# Patient Record
Sex: Female | Born: 1955 | Race: Black or African American | Hispanic: No | Marital: Single | Smoking: Never smoker
Health system: Southern US, Community
[De-identification: ages and names within clinical notes are randomized; demographics above are authoritative.]

## PROBLEM LIST (undated history)

## (undated) DIAGNOSIS — F419 Anxiety disorder, unspecified: Secondary | ICD-10-CM

## (undated) DIAGNOSIS — C801 Malignant (primary) neoplasm, unspecified: Secondary | ICD-10-CM

## (undated) DIAGNOSIS — M1990 Unspecified osteoarthritis, unspecified site: Secondary | ICD-10-CM

## (undated) DIAGNOSIS — M810 Age-related osteoporosis without current pathological fracture: Secondary | ICD-10-CM

## (undated) DIAGNOSIS — I10 Essential (primary) hypertension: Secondary | ICD-10-CM

## (undated) DIAGNOSIS — C9590 Leukemia, unspecified not having achieved remission: Secondary | ICD-10-CM

## (undated) HISTORY — DX: Unspecified osteoarthritis, unspecified site: M19.90

## (undated) HISTORY — DX: Anxiety disorder, unspecified: F41.9

## (undated) HISTORY — DX: Age-related osteoporosis without current pathological fracture: M81.0

## (undated) HISTORY — DX: Essential (primary) hypertension: I10

## (undated) HISTORY — DX: Malignant (primary) neoplasm, unspecified: C80.1

## (undated) HISTORY — DX: Leukemia, unspecified not having achieved remission: C95.90

---

## 1985-11-21 HISTORY — PX: TUBAL LIGATION: SHX77

## 1995-11-22 DIAGNOSIS — C9590 Leukemia, unspecified not having achieved remission: Secondary | ICD-10-CM

## 1995-11-22 HISTORY — PX: CHOLECYSTECTOMY: SHX55

## 1995-11-22 HISTORY — DX: Leukemia, unspecified not having achieved remission: C95.90

## 2004-11-21 ENCOUNTER — Emergency Department (HOSPITAL_COMMUNITY): Admission: EM | Admit: 2004-11-21 | Discharge: 2004-11-21 | Payer: Self-pay | Admitting: Emergency Medicine

## 2004-12-06 ENCOUNTER — Encounter (HOSPITAL_COMMUNITY): Admission: RE | Admit: 2004-12-06 | Discharge: 2005-01-05 | Payer: Self-pay | Admitting: Family Medicine

## 2004-12-23 ENCOUNTER — Ambulatory Visit (HOSPITAL_COMMUNITY): Admission: RE | Admit: 2004-12-23 | Discharge: 2004-12-23 | Payer: Self-pay | Admitting: Family Medicine

## 2008-12-01 ENCOUNTER — Encounter: Payer: Self-pay | Admitting: Orthopedic Surgery

## 2008-12-04 ENCOUNTER — Ambulatory Visit: Payer: Self-pay | Admitting: Orthopedic Surgery

## 2008-12-04 DIAGNOSIS — S9030XA Contusion of unspecified foot, initial encounter: Secondary | ICD-10-CM | POA: Insufficient documentation

## 2009-03-09 ENCOUNTER — Other Ambulatory Visit: Admission: RE | Admit: 2009-03-09 | Discharge: 2009-03-09 | Payer: Self-pay | Admitting: Obstetrics & Gynecology

## 2010-06-30 ENCOUNTER — Other Ambulatory Visit: Admission: RE | Admit: 2010-06-30 | Discharge: 2010-06-30 | Payer: Self-pay | Admitting: Obstetrics & Gynecology

## 2010-12-12 ENCOUNTER — Encounter: Payer: Self-pay | Admitting: Obstetrics & Gynecology

## 2010-12-12 ENCOUNTER — Encounter: Payer: Self-pay | Admitting: Pediatrics

## 2012-02-16 ENCOUNTER — Other Ambulatory Visit (HOSPITAL_COMMUNITY)
Admission: RE | Admit: 2012-02-16 | Discharge: 2012-02-16 | Disposition: A | Discharge status: Home / Self Care | Payer: BC Managed Care – PPO | Source: Ambulatory Visit | Attending: Obstetrics & Gynecology | Admitting: Obstetrics & Gynecology

## 2012-02-16 DIAGNOSIS — Z01419 Encounter for gynecological examination (general) (routine) without abnormal findings: Secondary | ICD-10-CM | POA: Insufficient documentation

## 2012-02-20 ENCOUNTER — Other Ambulatory Visit: Payer: Self-pay | Admitting: Obstetrics & Gynecology

## 2012-08-02 ENCOUNTER — Ambulatory Visit (INDEPENDENT_AMBULATORY_CARE_PROVIDER_SITE_OTHER): Payer: BC Managed Care – PPO | Admitting: Family Medicine

## 2012-08-02 ENCOUNTER — Encounter: Payer: Self-pay | Admitting: Family Medicine

## 2012-08-02 VITALS — BP 146/80 | HR 84 | Resp 18 | Ht 65.0 in | Wt 211.1 lb

## 2012-08-02 DIAGNOSIS — I10 Essential (primary) hypertension: Secondary | ICD-10-CM

## 2012-08-02 DIAGNOSIS — E669 Obesity, unspecified: Secondary | ICD-10-CM

## 2012-08-02 DIAGNOSIS — G4700 Insomnia, unspecified: Secondary | ICD-10-CM

## 2012-08-02 MED ORDER — FELODIPINE ER 10 MG PO TB24: 10.0000 mg | ORAL_TABLET | Freq: Every day | ORAL | Status: DC

## 2012-08-02 NOTE — Patient Instructions (Addendum)
Bring a copy of your labs  Start calcium ( 1200mg ) and Vit D ( 800IU)  Continue walking  Flu shot given  F/U 6 months

## 2012-08-03 ENCOUNTER — Encounter: Payer: Self-pay | Admitting: Family Medicine

## 2012-08-03 DIAGNOSIS — E669 Obesity, unspecified: Secondary | ICD-10-CM | POA: Insufficient documentation

## 2012-08-03 DIAGNOSIS — G4700 Insomnia, unspecified: Secondary | ICD-10-CM | POA: Insufficient documentation

## 2012-08-03 DIAGNOSIS — I10 Essential (primary) hypertension: Secondary | ICD-10-CM | POA: Insufficient documentation

## 2012-08-03 NOTE — Assessment & Plan Note (Signed)
She uses prn xanax #60 given at a time. I will review records for more details

## 2012-08-03 NOTE — Progress Notes (Signed)
  Subjective:    Patient ID: Judith Walker, female    DOB: November 10, 1956, 56 y.o.   MRN: [id number]  HPI Patient presents to establish medical care. Last Dr. Stephania Fragmin [facility]. Medications and History reviewed No specific concerns History of Leukemia treated in 1997- 1999 by [facility], now cured.  Insomnia- uses prn xanax. HTN- blood pressure has been well controlled on [facility] Mammogram, PAP- UTD- [facility] Due for Colonoscopy    Review of Systems   GEN- denies fatigue, fever, weight loss,weakness, recent illness HEENT- denies eye drainage, change in vision, nasal discharge, CVS- denies chest pain, palpitations RESP- denies SOB, cough, wheeze ABD- denies N/V, change in stools, abd pain GU- denies dysuria, hematuria, dribbling, incontinence MSK- denies joint pain, muscle aches, injury Neuro- denies headache, dizziness, syncope, seizure activity      Objective:   Physical Exam GEN- NAD, alert and oriented x3 HEENT- PERRL, EOMI, non injected sclera, pink conjunctiva, MMM, oropharynx clear Neck- Supple CVS- RRR, no murmur RESP-CTAB ABD-NABS,soft,NT,ND EXT- No edema Pulses- Radial, DP- 2+ Psych-normal affect and Mood        Assessment & Plan:    Discussed colonoscopy, 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

## 2012-08-03 NOTE — Assessment & Plan Note (Addendum)
No change to meds, she had labs recently done through her job for health screening she will bring a copy by

## 2012-10-08 ENCOUNTER — Other Ambulatory Visit: Payer: Self-pay

## 2013-04-05 ENCOUNTER — Other Ambulatory Visit: Payer: Self-pay | Admitting: Family Medicine

## 2013-04-12 ENCOUNTER — Ambulatory Visit (INDEPENDENT_AMBULATORY_CARE_PROVIDER_SITE_OTHER): Payer: BC Managed Care – PPO | Admitting: Family Medicine

## 2013-04-12 ENCOUNTER — Encounter: Payer: Self-pay | Admitting: Family Medicine

## 2013-04-12 VITALS — BP 124/82 | HR 90 | Resp 18 | Ht 65.0 in | Wt 214.1 lb

## 2013-04-12 DIAGNOSIS — G4700 Insomnia, unspecified: Secondary | ICD-10-CM

## 2013-04-12 DIAGNOSIS — Z1231 Encounter for screening mammogram for malignant neoplasm of breast: Secondary | ICD-10-CM

## 2013-04-12 DIAGNOSIS — E669 Obesity, unspecified: Secondary | ICD-10-CM

## 2013-04-12 DIAGNOSIS — I10 Essential (primary) hypertension: Secondary | ICD-10-CM

## 2013-04-12 DIAGNOSIS — Z1211 Encounter for screening for malignant neoplasm of colon: Secondary | ICD-10-CM

## 2013-04-12 MED ORDER — FELODIPINE ER 10 MG PO TB24: 10.0000 mg | ORAL_TABLET | Freq: Every day | ORAL | Status: DC

## 2013-04-12 NOTE — Progress Notes (Signed)
  Subjective:    Patient ID: Judith Walker, female    DOB: Feb 14, 1956, 57 y.o.   MRN: [id number]  HPI  Patient to follow chronic medical problems. She has no specific concerns. She is overdue for colonoscopy and mammogram but she wants to put off until the summer. She's tolerating her blood pressure medications. She did have labs by her wellness nurse but she forgot to fax them to my office.  Review of Systems   GEN- denies fatigue, fever, weight loss,weakness, recent illness HEENT- denies eye drainage, change in vision, nasal discharge, CVS- denies chest pain, palpitations RESP- denies SOB, cough, wheeze ABD- denies N/V, change in stools, abd pain GU- denies dysuria, hematuria, dribbling, incontinence MSK- denies joint pain, muscle aches, injury Neuro- denies headache, dizziness, syncope, seizure activity      Objective:   Physical Exam GEN- NAD, alert and oriented x3 HEENT- PERRL, EOMI, non injected sclera, pink conjunctiva, MMM, oropharynx clear CVS- RRR, no murmur RESP-CTAB EXT- No edema Pulses- Radial, DP- 2+        Assessment & Plan:

## 2013-04-12 NOTE — Patient Instructions (Signed)
Continue to work on diet and exercise Blood pressure looks good Continue medications Referral for colonoscopy Call for mammogram  Dentist- Dr. Lawrence Marseilles  [phone] Please send me the labs  F/U 6 months

## 2013-04-12 NOTE — Assessment & Plan Note (Signed)
She's gained 3 pounds since her last visit she started a new exercise routine with her family members Goal is to be less than 200 pounds by her next visit

## 2013-04-12 NOTE — Assessment & Plan Note (Signed)
Blood pressure well controlled medications refilled. She did have her wellness labs done within the past 6 months with her job she forgot to fax these to me she will go ahead and do this next week

## 2013-04-12 NOTE — Assessment & Plan Note (Signed)
Rare use of Xanax her sleep has improved

## 2013-04-26 ENCOUNTER — Telehealth: Payer: Self-pay

## 2013-04-26 NOTE — Telephone Encounter (Signed)
Pt was referred by Dr. Jeanice Lim for screening colonoscopy. LMOM for a return call.

## 2013-04-29 NOTE — Telephone Encounter (Signed)
LM with pt's daughter for a return call.

## 2013-05-07 NOTE — Telephone Encounter (Signed)
Another Letter sent to pt.

## 2013-07-01 NOTE — Telephone Encounter (Signed)
Pt has not returned calls or responded to letters.

## 2013-07-01 NOTE — Telephone Encounter (Signed)
noted 

## 2013-10-10 ENCOUNTER — Telehealth: Payer: Self-pay | Admitting: Family Medicine

## 2013-10-10 NOTE — Telephone Encounter (Signed)
Okay to refill #60 tablets

## 2013-10-10 NOTE — Telephone Encounter (Signed)
Ok to refill 

## 2013-10-10 NOTE — Telephone Encounter (Signed)
Patient needs refill on Xanax . Son past away Monday , she didn't realize she was out until then . She hasn't sleep since then .   [employer].

## 2013-10-11 MED ORDER — ALPRAZOLAM 0.5 MG PO TABS: 0.5000 mg | ORAL_TABLET | Freq: Four times a day (QID) | ORAL | Status: DC | PRN

## 2013-10-11 NOTE — Telephone Encounter (Signed)
Med phoned in °

## 2013-10-15 ENCOUNTER — Ambulatory Visit (INDEPENDENT_AMBULATORY_CARE_PROVIDER_SITE_OTHER): Payer: BC Managed Care – PPO | Admitting: Family Medicine

## 2013-10-15 ENCOUNTER — Encounter: Payer: Self-pay | Admitting: Family Medicine

## 2013-10-15 VITALS — BP 112/72 | HR 88 | Temp 98.2°F | Resp 18 | Ht 64.0 in | Wt 214.0 lb

## 2013-10-15 DIAGNOSIS — I10 Essential (primary) hypertension: Secondary | ICD-10-CM

## 2013-10-15 DIAGNOSIS — G4700 Insomnia, unspecified: Secondary | ICD-10-CM

## 2013-10-15 DIAGNOSIS — F4321 Adjustment disorder with depressed mood: Secondary | ICD-10-CM

## 2013-10-15 DIAGNOSIS — Z23 Encounter for immunization: Secondary | ICD-10-CM

## 2013-10-15 MED ORDER — FELODIPINE ER 10 MG PO TB24: 10.0000 mg | ORAL_TABLET | Freq: Every day | ORAL | Status: DC

## 2013-10-15 MED ORDER — ALPRAZOLAM 0.5 MG PO TABS: 0.5000 mg | ORAL_TABLET | Freq: Four times a day (QID) | ORAL | Status: DC | PRN

## 2013-10-15 NOTE — Progress Notes (Signed)
  Subjective:    Patient ID: Judith Walker, female    DOB: 1955-12-25, 57 y.o.   MRN: [id number]  HPI  Patient here to follow chronic medical problems. She tells me today that her son who is 50 years old passed away from cardiac arrest and respiratory failure from pneumonia. This was approximately one week ago. She is asking that her Xanax be filled as she's having difficulty sleeping has run out of this medication a few weeks ago. Her family is very supportive she has her other 5 children and her husband around her. Her appetite is decreased but she does not feel like she is at a breaking point. She's tried to take it one day at a time.  She did have repeat wellness screen labs 1 month ago, they are pending for her job  Request FLu shot    Review of Systems   GEN- denies fatigue, fever, weight loss,weakness, recent illness HEENT- denies eye drainage, change in vision, nasal discharge, CVS- denies chest pain, palpitations RESP- denies SOB, cough, wheeze ABD- denies N/V, change in stools, abd pain Neuro- denies headache, dizziness, syncope, seizure activity          Objective:   Physical Exam   GEN- NAD, alert and oriented x3 HEENT- PERRL, EOMI, non injected sclera, pink conjunctiva, MMM, oropharynx clear Neck- Supple, no thryomegaly CVS- RRR, no murmur RESP-CTAB EXT- No edema Pulses- Radial, DP- 2+ Psych- Normal affect and mood, no SI          Assessment & Plan:

## 2013-10-15 NOTE — Assessment & Plan Note (Signed)
Have advised her to wear here to help her in any way possible. I have refilled her Xanax advised she can take a half to one tablet during the day if needed.

## 2013-10-15 NOTE — Assessment & Plan Note (Signed)
Blood pressure is well-controlled. Her labs will be faxed to me for her wellness screen in October. I will have her followup in 3 months I do not have labs at that time we will just repeat in the office

## 2013-10-15 NOTE — Assessment & Plan Note (Signed)
History of chronic insomnia. She was already on low-dose xanax

## 2013-10-15 NOTE — Patient Instructions (Signed)
Call if I can help in anyway way Okay to use xanax during the day if needed Fax your blood work F/U 3 months

## 2014-05-18 ENCOUNTER — Other Ambulatory Visit: Payer: Self-pay | Admitting: Family Medicine

## 2014-05-19 NOTE — Telephone Encounter (Signed)
Refill appropriate and filled per protocol. 

## 2014-11-18 ENCOUNTER — Encounter: Payer: BC Managed Care – PPO | Admitting: Family Medicine

## 2014-12-02 ENCOUNTER — Ambulatory Visit (INDEPENDENT_AMBULATORY_CARE_PROVIDER_SITE_OTHER): Payer: BLUE CROSS/BLUE SHIELD | Admitting: Family Medicine

## 2014-12-02 ENCOUNTER — Encounter: Payer: Self-pay | Admitting: Family Medicine

## 2014-12-02 VITALS — BP 118/68 | HR 82 | Temp 98.8°F | Resp 14 | Ht 64.0 in | Wt 216.0 lb

## 2014-12-02 DIAGNOSIS — Z1321 Encounter for screening for nutritional disorder: Secondary | ICD-10-CM

## 2014-12-02 DIAGNOSIS — Z23 Encounter for immunization: Secondary | ICD-10-CM

## 2014-12-02 DIAGNOSIS — I10 Essential (primary) hypertension: Secondary | ICD-10-CM

## 2014-12-02 DIAGNOSIS — E669 Obesity, unspecified: Secondary | ICD-10-CM

## 2014-12-02 DIAGNOSIS — Z111 Encounter for screening for respiratory tuberculosis: Secondary | ICD-10-CM

## 2014-12-02 DIAGNOSIS — Z0000 Encounter for general adult medical examination without abnormal findings: Secondary | ICD-10-CM

## 2014-12-02 DIAGNOSIS — G4700 Insomnia, unspecified: Secondary | ICD-10-CM

## 2014-12-02 LAB — CBC WITH DIFFERENTIAL/PLATELET
Basophils Absolute: 0.1 10*3/uL (ref 0.0–0.1)
Basophils Relative: 1 % (ref 0–1)
Eosinophils Absolute: 0.2 10*3/uL (ref 0.0–0.7)
Eosinophils Relative: 3 % (ref 0–5)
HEMATOCRIT: 41.3 % (ref 36.0–46.0)
HEMOGLOBIN: 14 g/dL (ref 12.0–15.0)
LYMPHS ABS: 2.6 10*3/uL (ref 0.7–4.0)
Lymphocytes Relative: 51 % — ABNORMAL HIGH (ref 12–46)
MCH: 30.7 pg (ref 26.0–34.0)
MCHC: 33.9 g/dL (ref 30.0–36.0)
MCV: 90.6 fL (ref 78.0–100.0)
MONO ABS: 0.4 10*3/uL (ref 0.1–1.0)
MONOS PCT: 8 % (ref 3–12)
MPV: 10.1 fL (ref 8.6–12.4)
NEUTROS ABS: 1.9 10*3/uL (ref 1.7–7.7)
NEUTROS PCT: 37 % — AB (ref 43–77)
Platelets: 313 10*3/uL (ref 150–400)
RBC: 4.56 MIL/uL (ref 3.87–5.11)
RDW: 14.4 % (ref 11.5–15.5)
WBC: 5.1 10*3/uL (ref 4.0–10.5)

## 2014-12-02 LAB — COMPREHENSIVE METABOLIC PANEL
ALBUMIN: 4 g/dL (ref 3.5–5.2)
ALT: 10 U/L (ref 0–35)
AST: 16 U/L (ref 0–37)
Alkaline Phosphatase: 88 U/L (ref 39–117)
BUN: 9 mg/dL (ref 6–23)
CALCIUM: 9.5 mg/dL (ref 8.4–10.5)
CO2: 26 meq/L (ref 19–32)
Chloride: 104 mEq/L (ref 96–112)
Creat: 0.77 mg/dL (ref 0.50–1.10)
GLUCOSE: 76 mg/dL (ref 70–99)
POTASSIUM: 4.4 meq/L (ref 3.5–5.3)
SODIUM: 137 meq/L (ref 135–145)
TOTAL PROTEIN: 7.8 g/dL (ref 6.0–8.3)
Total Bilirubin: 0.5 mg/dL (ref 0.2–1.2)

## 2014-12-02 LAB — TSH: TSH: 0.603 u[IU]/mL (ref 0.350–4.500)

## 2014-12-02 NOTE — Assessment & Plan Note (Signed)
Work on dietary changes and weight loss

## 2014-12-02 NOTE — Assessment & Plan Note (Signed)
Well controlled  Note discussed calcium/Vitamin D chews

## 2014-12-02 NOTE — Patient Instructions (Addendum)
I recommend eye visit once a year I recommend dental visit every 6 months Goal is to  Exercise 30 minutes 5 days a week We will send a letter with lab results  Schedule Mammogram Return stool cards F/u1 YEAR

## 2014-12-02 NOTE — Assessment & Plan Note (Signed)
Continue xanax as needed

## 2014-12-02 NOTE — Progress Notes (Signed)
Patient ID: Judith Walker, female   DOB: Jan 16, 1956, 59 y.o.   MRN: [id number]   Subjective:    Patient ID: Judith Walker, female    DOB: Oct 09, 1956, 59 y.o.   MRN: [id number]  Patient presents for CPE- no PAP  patient here for complete physical exam. She also has a form to be completed for her job as she works for [employer]. She has no concerns today. She is taking her blood pressure medicine as prescribed she rarely uses Xanax per request a refill on both medication. She's not had a mammogram is overdue for colonoscopy. She is followed by GYN for her Pap smears.  She had some labs done at her wellness exam for her job and her cholesterol looked fairly good her LDL was at 123 her fasting glucose was normal she's not had a renal function and CBC vitamin D level done.  Followed by eye doctor- Dr. Candace Cruise  Review Of Systems:  GEN- denies fatigue, fever, weight loss,weakness, recent illness HEENT- denies eye drainage, change in vision, nasal discharge, CVS- denies chest pain, palpitations RESP- denies SOB, cough, wheeze ABD- denies N/V, change in stools, abd pain GU- denies dysuria, hematuria, dribbling, incontinence MSK- denies joint pain, muscle aches, injury Neuro- denies headache, dizziness, syncope, seizure activity       Objective:    BP 118/68 mmHg  Pulse 82  Temp(Src) 98.8 F (37.1 C) (Oral)  Resp 14  Ht 5\' 4"  (1.626 m)  Wt 216 lb (97.977 kg)  BMI 37.06 kg/m2 GEN- NAD, alert and oriented x3,obese  HEENT- PERRL, EOMI, non injected sclera, pink conjunctiva, MMM, oropharynx clear Neck- Supple, no thyromegaly CVS- RRR, no murmur RESP-CTAB ABD-NABS,soft,NT,ND EXT- No edema Pulses- Radial, DP- 2+        Assessment & Plan:      Problem List Items Addressed This Visit      Unprioritized   Obesity   Insomnia   Essential hypertension, benign - Primary   Relevant Orders      CBC with Differential      Comprehensive metabolic panel      TSH    Other Visit  Diagnoses    Routine general medical examination at a health care facility        CPE done, PAP deferred to GYN, schedule Mammogram, stool cards, she does not want Colonoscopy until the summer Flu shot, PPD for work    Encounter for vitamin deficiency screening        Relevant Orders       Vitamin D, 25-hydroxy    Need for prophylactic vaccination and inoculation against influenza        Relevant Orders       Flu Vaccine QUAD 36+ mos PF IM (Fluarix Quad PF) (Completed)    Screening-pulmonary TB        Relevant Orders       PPD (Completed)       Note: This dictation was prepared with Dragon dictation along with smaller Company secretary. Any transcriptional errors that result from this process are unintentional.

## 2014-12-03 LAB — VITAMIN D 25 HYDROXY (VIT D DEFICIENCY, FRACTURES): Vit D, 25-Hydroxy: 9 ng/mL — ABNORMAL LOW (ref 30–100)

## 2014-12-04 ENCOUNTER — Ambulatory Visit: Payer: BLUE CROSS/BLUE SHIELD | Admitting: Family Medicine

## 2014-12-04 ENCOUNTER — Other Ambulatory Visit: Payer: Self-pay | Admitting: Family Medicine

## 2014-12-04 ENCOUNTER — Encounter: Payer: Self-pay | Admitting: Family Medicine

## 2014-12-04 DIAGNOSIS — Z111 Encounter for screening for respiratory tuberculosis: Secondary | ICD-10-CM

## 2014-12-04 LAB — TB SKIN TEST
INDURATION: 0 mm
TB Skin Test: NEGATIVE

## 2014-12-04 MED ORDER — VITAMIN D (ERGOCALCIFEROL) 1.25 MG (50000 UNIT) PO CAPS
50000.0000 [IU] | ORAL_CAPSULE | ORAL | Status: DC
Start: 2014-12-04 — End: 2016-01-14

## 2014-12-04 NOTE — Progress Notes (Signed)
Pt returns for reading of PPD TB skin test applied 12/02/14.  Site free of any reaction.  Report given to patient for her employer.

## 2014-12-05 ENCOUNTER — Other Ambulatory Visit: Payer: Self-pay | Admitting: Family Medicine

## 2014-12-05 NOTE — Telephone Encounter (Signed)
Okay to refill? 

## 2014-12-05 NOTE — Telephone Encounter (Signed)
LRF 10/15/14 #60 + 1.  LOV 11/25  OK refill?

## 2014-12-09 NOTE — Telephone Encounter (Signed)
rx called in

## 2014-12-30 ENCOUNTER — Other Ambulatory Visit: Payer: Self-pay | Admitting: Family Medicine

## 2014-12-31 NOTE — Telephone Encounter (Signed)
Medication refilled per protocol. 

## 2015-07-29 ENCOUNTER — Other Ambulatory Visit: Payer: Self-pay | Admitting: Family Medicine

## 2015-07-29 NOTE — Telephone Encounter (Signed)
Ok to refill Xanax??  Last office visit 12/02/2014.  Last refill 12/09/2014.

## 2015-07-29 NOTE — Telephone Encounter (Signed)
Okay to refill? 

## 2015-07-30 NOTE — Telephone Encounter (Signed)
Medication called to pharmacy. 

## 2016-01-08 ENCOUNTER — Other Ambulatory Visit: Payer: Self-pay | Admitting: Family Medicine

## 2016-01-08 ENCOUNTER — Encounter: Payer: Self-pay | Admitting: *Deleted

## 2016-01-08 NOTE — Telephone Encounter (Signed)
Medication filled x1 with no refills.   Requires office visit before any further refills can be given.   Letter sent.  

## 2016-01-14 ENCOUNTER — Encounter: Payer: Self-pay | Admitting: Family Medicine

## 2016-01-14 ENCOUNTER — Encounter: Payer: Self-pay | Admitting: Physician Assistant

## 2016-01-14 ENCOUNTER — Ambulatory Visit (INDEPENDENT_AMBULATORY_CARE_PROVIDER_SITE_OTHER): Payer: BLUE CROSS/BLUE SHIELD | Admitting: Physician Assistant

## 2016-01-14 VITALS — BP 124/80 | HR 96 | Temp 101.9°F | Resp 18 | Wt 213.0 lb

## 2016-01-14 DIAGNOSIS — J111 Influenza due to unidentified influenza virus with other respiratory manifestations: Secondary | ICD-10-CM

## 2016-01-14 DIAGNOSIS — R509 Fever, unspecified: Secondary | ICD-10-CM

## 2016-01-14 DIAGNOSIS — J029 Acute pharyngitis, unspecified: Secondary | ICD-10-CM | POA: Diagnosis not present

## 2016-01-14 LAB — INFLUENZA A AND B AG, IMMUNOASSAY
INFLUENZA A ANTIGEN: NOT DETECTED
Influenza B Antigen: NOT DETECTED

## 2016-01-14 LAB — STREP GROUP A AG, W/REFLEX TO CULT: STREGTOCOCCUS GROUP A AG SCREEN: NOT DETECTED

## 2016-01-14 MED ORDER — OSELTAMIVIR PHOSPHATE 75 MG PO CAPS: 75.0000 mg | ORAL_CAPSULE | Freq: Two times a day (BID) | ORAL | Status: DC

## 2016-01-14 NOTE — Progress Notes (Signed)
Patient ID: TERIANN BOLLE MRN: [id number], DOB: 1956/06/28, 60 y.o. Date of Encounter: 01/14/2016, 12:25 PM    Chief Complaint:  Chief Complaint  Patient presents with  . sick x 1 day    sore throat, fever, chills     HPI: 60 y.o. year old AA female presents with above.   States that symptoms started yesterday. developed scratchy throat then last night started feeling achy all over developed fevers and chills and cough. Last night she checked temperature and got 102. Here -- her temperature is 101.9. Says that she has a lot of cough. Says that she is blowing her nose some and does have some nasal congestion.  She lives alone. 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     Home Meds:   Outpatient Prescriptions Prior to Visit  Medication Sig Dispense Refill  . ALPRAZolam (XANAX) 0.5 MG tablet TAKE ONE TABLET EVERY 6 HOURS AS NEEDED 60 tablet 0  . aspirin 81 MG tablet Take 81 mg by mouth daily.    . felodipine (PLENDIL) 10 MG 24 hr tablet TAKE ONE (1) TABLET EACH DAY 30 tablet 0  . Calcium Carbonate-Vitamin D (CALCIUM + D PO) Take by mouth daily. Reported on 01/14/2016    . Vitamin D, Ergocalciferol, (DRISDOL) 50000 UNITS CAPS capsule Take 1 capsule (50,000 Units total) by mouth every 7 (seven) days. (Patient not taking: Reported on 01/14/2016) 28 capsule 0   No facility-administered medications prior to visit.    Allergies: No Known Allergies    Review of Systems: See HPI for pertinent ROS. All other ROS negative.    Physical Exam: Blood pressure 124/80, pulse 96, temperature 101.9 F (38.8 C), temperature source Oral, resp. rate 18, weight 213 lb (96.616 kg)., Body mass index is 36.54 kg/(m^2). General:  AAF. Appears in no acute distress. HEENT: Normocephalic, atraumatic, eyes without discharge, sclera non-icteric, nares are without discharge. Bilateral auditory canals clear, TM's are without  perforation, pearly grey and translucent with reflective cone of light bilaterally. Oral cavity moist, posterior pharynx without exudate, erythema, peritonsillar abscess.  Neck: Supple. No thyromegaly. No lymphadenopathy. Lungs: Clear bilaterally to auscultation without wheezes, rales, or rhonchi. Breathing is unlabored. Heart: Regular rhythm. No murmurs, rubs, or gallops. Msk:  Strength and tone normal for age. Extremities/Skin: Warm and dry. Neuro: Alert and oriented X 3. Moves all extremities spontaneously. Gait is normal. CNII-XII grossly in tact. Psych:  Responds to questions appropriately with a normal affect.   Results for orders placed or performed in visit on 01/14/16  STREP GROUP A AG, W/REFLEX TO CULT  Result Value Ref Range   SOURCE THROAT    STREGTOCOCCUS GROUP A AG SCREEN Not Detected   Influenza A and B Ag, Immunoassay  Result Value Ref Range   Source: NASAL    Influenza A Antigen Not Detected Not Detected   Influenza B Antigen Not Detected Not Detected     ASSESSMENT AND PLAN:  60 y.o. year old female with  1. Influenza Lou test negative but clinically consistent with influenza. Recommend that she go ahead and start Tamiflu immediately take as directed and complete it. She is to take Tylenol or Motrin to keep fever control. Can use other medications for symptom relief if needed. Follow up if symptoms worsen significantly or if they are not resolving in 7-10 days. Note given for out of work today and tomorrow which is Friday. Plan to  return to work Monday. She is to stay home and avoid spreading germs. - oseltamivir (TAMIFLU) 75 MG capsule; Take 1 capsule (75 mg total) by mouth 2 (two) times daily.  Dispense: 10 capsule; Refill: 0  2. Sorethroat - STREP GROUP A AG, W/REFLEX TO CULT - Influenza A and B Ag, Immunoassay  3. Fever and chills - STREP GROUP A AG, W/REFLEX TO CULT - Influenza A and B Ag, Immunoassay   Signed, [address] [place], [place],  [facility] 01/14/2016 12:25 PM

## 2016-02-19 ENCOUNTER — Other Ambulatory Visit: Payer: Self-pay | Admitting: Family Medicine

## 2016-02-19 NOTE — Telephone Encounter (Signed)
Refill appropriate and filled per protocol. 

## 2016-06-01 ENCOUNTER — Ambulatory Visit (INDEPENDENT_AMBULATORY_CARE_PROVIDER_SITE_OTHER): Payer: BLUE CROSS/BLUE SHIELD | Admitting: Physician Assistant

## 2016-06-01 ENCOUNTER — Encounter: Payer: Self-pay | Admitting: Physician Assistant

## 2016-06-01 VITALS — BP 120/88 | HR 84 | Temp 98.2°F | Resp 18 | Wt 214.0 lb

## 2016-06-01 DIAGNOSIS — I10 Essential (primary) hypertension: Secondary | ICD-10-CM | POA: Diagnosis not present

## 2016-06-01 DIAGNOSIS — E669 Obesity, unspecified: Secondary | ICD-10-CM

## 2016-06-01 DIAGNOSIS — E559 Vitamin D deficiency, unspecified: Secondary | ICD-10-CM

## 2016-06-01 MED ORDER — FELODIPINE ER 10 MG PO TB24: ORAL_TABLET | ORAL | Status: DC

## 2016-06-01 NOTE — Progress Notes (Signed)
Patient ID: SURIA DEW MRN: [id number], DOB: 1956/01/12, 60 y.o. Date of Encounter: @DATE @  Chief Complaint:  Chief Complaint  Patient presents with  . needs BP meds refilled    is fasting    HPI: 60 y.o. year old AA female  presents For routine office visit.  Her last routine office visit was with Dr. Buelah Manis 12/02/2014. I reviewed that office visit note today. At that time her blood pressure was controlled so current medication was continued without change. At that visit labs were performed. CBC CMET TSH were all normal.  Vitamin D level came back low at 9. Dr. Buelah Manis said to take vitamin D 50,000 units for 12 weeks then take calcium 1200 mg vitamin D 1000 units.  Today patient states that she did take the prescription vitamin D for the 12 weeks but then never did take the over-the-counter vitamin D. Says that she is taking calcium daily.  She continues to take her blood pressure pill daily and is having no adverse effects. She does not check her blood pressure at home or anywhere between office visits here. She states that she rarely needs to use Xanax.   Past Medical History  Diagnosis Date  . Hypertension   . Leukemia [facility] [place] Meds: Outpatient Prescriptions Prior to Visit  Medication Sig Dispense Refill  . ALPRAZolam (XANAX) 0.5 MG tablet TAKE ONE TABLET EVERY 6 HOURS AS NEEDED 60 tablet 0  . aspirin 81 MG tablet Take 81 mg by mouth daily.    . Calcium Carbonate-Vitamin D (CALCIUM + D PO) Take by mouth daily. Reported on 01/14/2016    . felodipine (PLENDIL) 10 MG 24 hr tablet TAKE ONE (1) TABLET BY MOUTH EVERY DAY 30 tablet 3  . oseltamivir (TAMIFLU) 75 MG capsule Take 1 capsule (75 mg total) by mouth 2 (two) times daily. 10 capsule 0   No facility-administered medications prior to visit.    Allergies: No Known Allergies  Social History   Social History  . Marital Status: Married    Spouse Name: N/A  . Number of  Children: N/A  . Years of Education: N/A   Occupational History  . Not on file.   Social History Main Topics  . Smoking status: Never Smoker   . Smokeless tobacco: Never Used  . Alcohol Use: No  . Drug Use: No  . Sexual Activity: Not Currently   Other Topics Concern  . Not on file   Social History Narrative    Family History  Problem Relation Age of Onset  . Hypertension Mother   . Hyperlipidemia Mother   . Heart disease Mother      Review of Systems:  See HPI for pertinent ROS. All other ROS negative.    Physical Exam: Blood pressure 120/88, pulse 84, temperature 98.2 F (36.8 C), temperature source Oral, resp. rate 18, weight 214 lb (97.07 kg)., Body mass index is 36.72 kg/(m^2). General: Obese AAF. Appears in no acute distress. Neck: Supple. No thyromegaly. No lymphadenopathy. No carotid bruit. Lungs: Clear bilaterally to auscultation without wheezes, rales, or rhonchi. Breathing is unlabored. Heart: RRR with S1 S2. No murmurs, rubs, or gallops. Musculoskeletal:  Strength and tone normal for age. Extremities/Skin: Warm and dry.  No LE edema.  Neuro: Alert and oriented X 3. Moves all extremities spontaneously. Gait is normal. CNII-XII grossly in tact. Psych:  Responds to questions appropriately with a normal affect.  ASSESSMENT AND PLAN:  60 y.o. year old female with  1. Essential hypertension, benign Blood Pressure is controlled/at goal. Continue current blood pressure medication.  2. Vitamin D deficiency 12/02/2014 vitamin D level was low at 9. After that-- patient did take the prescription strength vitamin D for 12 weeks, but never took any over-the-counter vitamin D after that. Recheck level at this time and then treat accordingly. - VITAMIN D 25 Hydroxy (Vit-D Deficiency, Fractures)  3. Obesity Again today discussed diet exercise to manage this and control her weight.  Anxiety She says that she very rarely needs to use the Xanax. No Rx printed at [facility]  today.   Reviewed that her last visit here was 1 year 6 months ago. Discussed with her that she needs to have follow-up office visit here 6 months.   Signed, [address] [place], [place], [facility] 06/01/2016 10:15 AM

## 2016-06-02 ENCOUNTER — Telehealth: Payer: Self-pay | Admitting: Family Medicine

## 2016-06-02 LAB — VITAMIN D 25 HYDROXY (VIT D DEFICIENCY, FRACTURES): VIT D 25 HYDROXY: 20 ng/mL — AB (ref 30–100)

## 2016-06-02 NOTE — Telephone Encounter (Signed)
Pt aware of lab results and provider recommendations 

## 2016-06-02 NOTE — Telephone Encounter (Signed)
-----   Message from Orlena Sheldon, PA-C sent at 06/02/2016  9:19 AM EDT ----- At recent [facility], reviewed that in 11/2014--pt took the prescription vitamin D for the 12 weeks but then never started over-the-counter vitamin D after that. Tell her to start over-the-counter vitamin D 2000 units daily.

## 2016-09-15 ENCOUNTER — Ambulatory Visit (INDEPENDENT_AMBULATORY_CARE_PROVIDER_SITE_OTHER): Payer: BLUE CROSS/BLUE SHIELD | Admitting: *Deleted

## 2016-09-15 DIAGNOSIS — Z23 Encounter for immunization: Secondary | ICD-10-CM

## 2016-09-15 NOTE — Progress Notes (Signed)
Patient ID: Judith Walker, female   DOB: [birth year], 60 y.o.   MRN: [id number] Patient seen in office for Influenza Vaccination.   Tolerated IM administration well.   Immunization history updated.

## 2016-12-14 ENCOUNTER — Encounter: Payer: Self-pay | Admitting: Family Medicine

## 2016-12-14 ENCOUNTER — Ambulatory Visit (INDEPENDENT_AMBULATORY_CARE_PROVIDER_SITE_OTHER): Payer: BLUE CROSS/BLUE SHIELD | Admitting: Family Medicine

## 2016-12-14 VITALS — BP 120/68 | HR 90 | Temp 102.3°F | Resp 16 | Ht 64.0 in | Wt 207.0 lb

## 2016-12-14 DIAGNOSIS — R6889 Other general symptoms and signs: Secondary | ICD-10-CM

## 2016-12-14 DIAGNOSIS — B360 Pityriasis versicolor: Secondary | ICD-10-CM

## 2016-12-14 LAB — STREP GROUP A AG, W/REFLEX TO CULT: STREGTOCOCCUS GROUP A AG SCREEN: NOT DETECTED

## 2016-12-14 LAB — INFLUENZA A AND B AG, IMMUNOASSAY
INFLUENZA A ANTIGEN: NOT DETECTED
Influenza B Antigen: NOT DETECTED

## 2016-12-14 MED ORDER — IBUPROFEN 200 MG PO TABS
400.0000 mg | ORAL_TABLET | Freq: Once | ORAL | Status: AC
  Administered 2016-12-14: 400 mg via ORAL

## 2016-12-14 MED ORDER — OSELTAMIVIR PHOSPHATE 75 MG PO CAPS: 75.0000 mg | ORAL_CAPSULE | Freq: Two times a day (BID) | ORAL | 0 refills | Status: DC

## 2016-12-14 MED ORDER — KETOCONAZOLE 2 % EX SHAM: 1.0000 "application " | MEDICATED_SHAMPOO | CUTANEOUS | 2 refills | Status: DC

## 2016-12-14 MED ORDER — FLUCONAZOLE 100 MG PO TABS: ORAL_TABLET | ORAL | 0 refills | Status: DC

## 2016-12-14 MED ORDER — FIRST-DUKES MOUTHWASH MT SUSP: OROMUCOSAL | 0 refills | Status: DC

## 2016-12-14 NOTE — Patient Instructions (Addendum)
Apply the nizoral to your skin twice a day  Take anti-fungal -  Fluconazole   after your illness resolves  Schedule Physical for spring       Give note for work Tuesday - Friday due to flu symptoms  Can return on Monday

## 2016-12-14 NOTE — Addendum Note (Signed)
Addended by: Sheral Flow on: 12/14/2016 02:50 PM   Modules accepted: Orders

## 2016-12-14 NOTE — Progress Notes (Signed)
   Subjective:    Patient ID: Judith Walker, female    DOB: 1956-07-11, 61 y.o.   MRN: [id number]  Patient presents for Illness (x2 days- fever, chills, muscle aches, scratchy sore throat, nonproductive cough) Patient here with fever chills body aches sore throat and nonproductive cough which started 2 days ago. 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 She is taking ibuprofen for fever last night but nothing today. She's not had any vomiting no diarrhea. Her throat is very severe states she cannot eat regular food because of the pain she has been drinking fluids and has been eating applesauce.  She's also concerned about a rash that comes and goes mostly in her chest. States when she gets itchy or sweats he will pop out at least little white spots across her chest sometimes it goes away on its own. It often occurs during the summertime as well.   Review Of Systems: per above   GEN- + fatigue, +fever, weight loss,weakness, recent illness HEENT- denies eye drainage, change in vision, nasal discharge, CVS- denies chest pain, palpitations RESP- denies SOB, +cough, wheeze ABD- denies N/V, change in stools, abd pain GU- denies dysuria, hematuria, dribbling, incontinence MSK- denies joint pain, +muscle aches, injury Neuro- denies headache, dizziness, syncope, seizure activity       Objective:    BP 120/68 (BP Location: Left Arm, Patient Position: Sitting, Cuff Size: Large)   Pulse 90   Temp (!) 102.3 F (39.1 C) (Oral)   Resp 16   Ht 5\' 4"  (1.626 m)   Wt 207 lb (93.9 kg)   SpO2 98%   BMI 35.53 kg/m  GEN- NAD, alert and oriented x3 HEENT- PERRL, EOMI, non injected sclera, pink conjunctiva, MMM, oropharynx injected, no exudate, no maxillary sinus tenderness, TM clear bilat  Neck- Supple, + cervical LAD, ttp of nodes  CVS- RRR, no murmur RESP-CTAB ABD-NABS,soft,NT,ND  Skin- hypogmented macules scattered on chest and neck, no erythema, no vesivles  EXT- No  edema Pulses- Radial 2+        Assessment & Plan:      Problem List Items Addressed This Visit    None    Visit Diagnoses    Flu-like symptoms    -  Primary   Start tamilfu exposure with children and flu at work. Has significant pharngytis, sent over magic mouthwash as well, strep neg, throat culture pending    Relevant Orders   Influenza A and B Ag, Immunoassay (Completed)   STREP GROUP A AG, W/REFLEX TO CULT (Completed)   Tinea versicolor       Treat with nizoral topical and fluconzale q weekly for 4 weeks      Note: This dictation was prepared with Dragon dictation along with smaller phrase technology. Any transcriptional errors that result from this process are unintentional.

## 2016-12-16 LAB — CULTURE, GROUP A STREP

## 2017-03-28 ENCOUNTER — Other Ambulatory Visit: Payer: Self-pay | Admitting: Family Medicine

## 2017-04-13 ENCOUNTER — Encounter: Payer: Self-pay | Admitting: Physician Assistant

## 2017-04-13 ENCOUNTER — Ambulatory Visit (INDEPENDENT_AMBULATORY_CARE_PROVIDER_SITE_OTHER): Payer: BLUE CROSS/BLUE SHIELD | Admitting: Physician Assistant

## 2017-04-13 VITALS — BP 134/88 | HR 94 | Temp 98.1°F | Resp 14 | Wt 205.4 lb

## 2017-04-13 DIAGNOSIS — K0889 Other specified disorders of teeth and supporting structures: Secondary | ICD-10-CM | POA: Diagnosis not present

## 2017-04-13 MED ORDER — AMOXICILLIN 875 MG PO TABS: 875.0000 mg | ORAL_TABLET | Freq: Two times a day (BID) | ORAL | 0 refills | Status: DC

## 2017-04-13 MED ORDER — TRAMADOL HCL 50 MG PO TABS: ORAL_TABLET | ORAL | 0 refills | Status: DC

## 2017-04-13 NOTE — Progress Notes (Signed)
Patient ID: Judith Walker MRN: [id number], DOB: 1956/10/30, 61 y.o. Date of Encounter: 04/13/2017, 12:41 PM    Chief Complaint:  Chief Complaint  Patient presents with  . left side ear/face pain    x3days     HPI: 61 y.o. year old female presents with above.   Says that she knows that she has bad teeth that need to be pulled but does not have dental insurance so has been putting off any of this treatment. Left lower back tooth has been throbbing for the past 3 days. Also is having pain up towards the left ear and doesn't know if that is radiating up from the tooth or whether she has a problem with her ear.  No other complaints or concerns. Has had no fevers or chills. Has had no nasal congestion or mucus from the nose and no sore throat.     Home Meds:   Outpatient Medications Prior to Visit  Medication Sig Dispense Refill  . ALPRAZolam (XANAX) 0.5 MG tablet TAKE ONE TABLET EVERY 6 HOURS AS NEEDED 60 tablet 0  . aspirin 81 MG tablet Take 81 mg by mouth daily.    . Calcium Carbonate-Vitamin D (CALCIUM + D PO) Take by mouth daily. Reported on 01/14/2016    . Cholecalciferol (VITAMIN D3) 2000 units TABS Take 1 tablet by mouth daily.    . felodipine (PLENDIL) 10 MG 24 hr tablet TAKE ONE (1) TABLET BY MOUTH EVERY DAY 90 tablet 1  . ketoconazole (NIZORAL) 2 % shampoo Apply 1 application topically 2 (two) times a week. To affected areas 120 mL 2  . Diphenhyd-Hydrocort-Nystatin (FIRST-DUKES MOUTHWASH) SUSP Swish and gargle/swallow 1 teaspoon four times a day as needed for pain for 1 week 1 Bottle 0  . felodipine (PLENDIL) 10 MG 24 hr tablet TAKE ONE (1) TABLET BY MOUTH EVERY DAY 30 tablet 1  . fluconazole (DIFLUCAN) 100 MG tablet Take 1 tablet q weekly for 4 weeks 4 tablet 0  . oseltamivir (TAMIFLU) 75 MG capsule Take 1 capsule (75 mg total) by mouth 2 (two) times daily. 10 capsule 0   No facility-administered medications prior to visit.     Allergies: No Known Allergies     Review of Systems: See HPI for pertinent ROS. All other ROS negative.    Physical Exam: Blood pressure 134/88, pulse 94, temperature 98.1 F (36.7 C), temperature source Oral, resp. rate 14, weight 205 lb 6.4 oz (93.2 kg), SpO2 98 %., Body mass index is 35.26 kg/m. General:  AAF. Appears in no acute distress. HEENT: Normocephalic, atraumatic, eyes without discharge, sclera non-icteric, nares are without discharge. Bilateral auditory canals clear, TM's are without perforation, pearly grey and translucent with reflective cone of light bilaterally. Left lower teeth--the back 2 teeth are both decayed down to the gum. The gum around and between these teeth is swollen and red.  Neck: Supple. No thyromegaly. No lymphadenopathy. Lungs: Clear bilaterally to auscultation without wheezes, rales, or rhonchi. Breathing is unlabored. Heart: Regular rhythm. No murmurs, rubs, or gallops. Msk:  Strength and tone normal for age. Extremities/Skin: Warm and dry. Neuro: Alert and oriented X 3. Moves all extremities spontaneously. Gait is normal. CNII-XII grossly in tact. Psych:  Responds to questions appropriately with a normal affect.     ASSESSMENT AND PLAN:  61 y.o. year old female with  1. Tooth pain I explained to her that she has to call the dentist and schedule appointment with dentist and that only dental work  will address/treat the underlying problem. Explained to her that the medicine I am giving her is only a "Band-Aid "only temporary treatment to hold her over and give her some relief but that she must follow-up with dentist and that we cannot continue to give refills on these medicines. She voices understanding and agrees. She is to take the amoxicillin as directed and continues the tramadol as needed for pain relief. - amoxicillin (AMOXIL) 875 MG tablet; Take 1 tablet (875 mg total) by mouth 2 (two) times daily.  Dispense: 14 tablet; Refill: 0 - traMADol (ULTRAM) 50 MG tablet; Take 1 -2 every  8 hours as needed for pain  Dispense: 60 tablet; Refill: 0   Signed, [address] [place], [place], [facility] 04/13/2017 12:41 PM

## 2017-04-14 ENCOUNTER — Ambulatory Visit: Payer: BLUE CROSS/BLUE SHIELD | Admitting: Family Medicine

## 2017-06-28 ENCOUNTER — Other Ambulatory Visit: Payer: Self-pay | Admitting: Physician Assistant

## 2017-08-03 ENCOUNTER — Telehealth: Payer: Self-pay | Admitting: Family Medicine

## 2017-08-03 MED ORDER — FELODIPINE ER 10 MG PO TB24: ORAL_TABLET | ORAL | 1 refills | Status: DC

## 2017-08-03 NOTE — Telephone Encounter (Signed)
Pt needs refill on plendil sent to [facility] pharmacy.

## 2017-08-03 NOTE — Telephone Encounter (Signed)
rx sent to pharmacy

## 2017-10-11 ENCOUNTER — Encounter: Payer: Self-pay | Admitting: Family Medicine

## 2017-10-11 ENCOUNTER — Other Ambulatory Visit: Payer: Self-pay

## 2017-10-11 ENCOUNTER — Ambulatory Visit: Payer: BLUE CROSS/BLUE SHIELD | Admitting: Family Medicine

## 2017-10-11 VITALS — BP 132/74 | HR 80 | Temp 98.1°F | Resp 16 | Ht 64.0 in | Wt 203.0 lb

## 2017-10-11 DIAGNOSIS — I10 Essential (primary) hypertension: Secondary | ICD-10-CM | POA: Diagnosis not present

## 2017-10-11 DIAGNOSIS — E6609 Other obesity due to excess calories: Secondary | ICD-10-CM

## 2017-10-11 DIAGNOSIS — F5101 Primary insomnia: Secondary | ICD-10-CM

## 2017-10-11 DIAGNOSIS — Z23 Encounter for immunization: Secondary | ICD-10-CM | POA: Diagnosis not present

## 2017-10-11 DIAGNOSIS — F411 Generalized anxiety disorder: Secondary | ICD-10-CM | POA: Diagnosis not present

## 2017-10-11 DIAGNOSIS — Z6834 Body mass index (BMI) 34.0-34.9, adult: Secondary | ICD-10-CM

## 2017-10-11 DIAGNOSIS — E559 Vitamin D deficiency, unspecified: Secondary | ICD-10-CM | POA: Diagnosis not present

## 2017-10-11 MED ORDER — FELODIPINE ER 10 MG PO TB24: ORAL_TABLET | ORAL | 1 refills | Status: DC

## 2017-10-11 MED ORDER — ALPRAZOLAM 0.5 MG PO TABS: ORAL_TABLET | ORAL | 0 refills | Status: DC

## 2017-10-11 MED ORDER — FELODIPINE ER 10 MG PO TB24: ORAL_TABLET | ORAL | 3 refills | Status: DC

## 2017-10-11 NOTE — Patient Instructions (Signed)
F/U 1 year for Physical Flu shot given  We will call with lab results

## 2017-10-11 NOTE — Progress Notes (Signed)
   Subjective:    Patient ID: Judith Walker, female    DOB: 1956-01-10, 61 y.o.   MRN: [id number]  Patient presents for Medication Management (needs refills on BP meds) and Injections (influenza)    Pt here to f/u medications. Doing well no concerns   Flu shot due     HTN- taking felodipine regulary ,no concerns, no swelling   Trying to watch diet and walk some for exercise    Taking vitamins and ASA     Anxiety and insomnia- taking as needed, today is Judith Walker of her sons death 4 years ago   uses very rarely , last filled in 2017 some time     [facility]- [facility]    Review Of Systems:  GEN- denies fatigue, fever, weight loss,weakness, recent illness HEENT- denies eye drainage, change in vision, nasal discharge, CVS- denies chest pain, palpitations RESP- denies SOB, cough, wheeze ABD- denies N/V, change in stools, abd pain GU- denies dysuria, hematuria, dribbling, incontinence MSK- denies joint pain, muscle aches, injury Neuro- denies headache, dizziness, syncope, seizure activity       Objective:    BP 132/74   Pulse 80   Temp 98.1 F (36.7 C) (Oral)   Resp 16   Ht 5\' 4"  (1.626 m)   Wt 203 lb (92.1 kg)   SpO2 97%   BMI 34.84 kg/m  GEN- NAD, alert and oriented x3,obese  HEENT- PERRL, EOMI, non injected sclera, pink conjunctiva, MMM, oropharynx clear Neck- Supple, no thyromegaly CVS- RRR, no murmur RESP-CTAB ABD-NABS,soft,NT,ND Psych- normal affect and mood  EXT- No edema Pulses- Radial, DP- 2+        Assessment & Plan:      Problem List Items Addressed This Visit      Unprioritized   Insomnia   Vitamin D deficiency   Relevant Orders   Vitamin D, 25-hydroxy   Obesity    Continue to encourage healthy eating and weight loss.      GAD (generalized anxiety disorder)    Refilled her Xanax she uses very sparingly.  This is the anniversary of her son's death.  She does have good support.      Relevant Medications   ALPRAZolam (XANAX) 0.5 MG  tablet   Essential hypertension, benign - Primary    Pressure controlled continue current medication.  Obtain fasting labs today.      Relevant Medications   felodipine (PLENDIL) 10 MG 24 hr tablet   Other Relevant Orders   CBC with Differential/Platelet   Comprehensive metabolic panel   Lipid panel    Other Visit Diagnoses    Need for immunization against influenza       Relevant Orders   Flu Vaccine QUAD 36+ mos IM (Completed)      Note: This dictation was prepared with Dragon dictation along with smaller phrase technology. Any transcriptional errors that result from this process are unintentional.

## 2017-10-11 NOTE — Assessment & Plan Note (Signed)
Refilled her Xanax she uses very sparingly.  This is the anniversary of her son's death.  She does have good support.

## 2017-10-11 NOTE — Assessment & Plan Note (Signed)
Continue to encourage healthy eating and weight loss.

## 2017-10-11 NOTE — Assessment & Plan Note (Signed)
Pressure controlled continue current medication.  Obtain fasting labs today.

## 2017-10-12 LAB — CBC WITH DIFFERENTIAL/PLATELET
BASOS PCT: 1.5 %
Basophils Absolute: 72 cells/uL (ref 0–200)
EOS ABS: 82 {cells}/uL (ref 15–500)
Eosinophils Relative: 1.7 %
HEMATOCRIT: 39.7 % (ref 35.0–45.0)
Hemoglobin: 13.2 g/dL (ref 11.7–15.5)
LYMPHS ABS: 2184 {cells}/uL (ref 850–3900)
MCH: 29.5 pg (ref 27.0–33.0)
MCHC: 33.2 g/dL (ref 32.0–36.0)
MCV: 88.8 fL (ref 80.0–100.0)
MPV: 10.4 fL (ref 7.5–12.5)
Monocytes Relative: 8.1 %
Neutro Abs: 2074 cells/uL (ref 1500–7800)
Neutrophils Relative %: 43.2 %
Platelets: 328 10*3/uL (ref 140–400)
RBC: 4.47 10*6/uL (ref 3.80–5.10)
RDW: 12.9 % (ref 11.0–15.0)
Total Lymphocyte: 45.5 %
WBC: 4.8 10*3/uL (ref 3.8–10.8)
WBCMIX: 389 {cells}/uL (ref 200–950)

## 2017-10-12 LAB — LIPID PANEL
CHOL/HDL RATIO: 3.5 (calc) (ref <5.0)
CHOLESTEROL: 175 mg/dL (ref <200)
HDL: 50 mg/dL — ABNORMAL LOW (ref >50)
LDL Cholesterol (Calc): 110 mg/dL (calc) — ABNORMAL HIGH
NON-HDL CHOLESTEROL (CALC): 125 mg/dL (ref <130)
Triglycerides: 68 mg/dL (ref <150)

## 2017-10-12 LAB — COMPREHENSIVE METABOLIC PANEL
AG RATIO: 1.1 (calc) (ref 1.0–2.5)
ALBUMIN MSPROF: 4.1 g/dL (ref 3.6–5.1)
ALKALINE PHOSPHATASE (APISO): 89 U/L (ref 33–130)
ALT: 7 U/L (ref 6–29)
AST: 13 U/L (ref 10–35)
BILIRUBIN TOTAL: 0.5 mg/dL (ref 0.2–1.2)
BUN: 9 mg/dL (ref 7–25)
CALCIUM: 9.4 mg/dL (ref 8.6–10.4)
CO2: 27 mmol/L (ref 20–32)
CREATININE: 0.8 mg/dL (ref 0.50–0.99)
Chloride: 104 mmol/L (ref 98–110)
GLOBULIN: 3.7 g/dL (ref 1.9–3.7)
Glucose, Bld: 80 mg/dL (ref 65–99)
POTASSIUM: 4.1 mmol/L (ref 3.5–5.3)
Sodium: 138 mmol/L (ref 135–146)
Total Protein: 7.8 g/dL (ref 6.1–8.1)

## 2017-10-12 LAB — VITAMIN D 25 HYDROXY (VIT D DEFICIENCY, FRACTURES): Vit D, 25-Hydroxy: 22 ng/mL — ABNORMAL LOW (ref 30–100)

## 2017-10-16 ENCOUNTER — Other Ambulatory Visit: Payer: Self-pay | Admitting: *Deleted

## 2017-10-16 MED ORDER — VITAMIN D (ERGOCALCIFEROL) 1.25 MG (50000 UNIT) PO CAPS: 50000.0000 [IU] | ORAL_CAPSULE | ORAL | 1 refills | Status: DC

## 2018-03-07 ENCOUNTER — Encounter: Payer: Self-pay | Admitting: Family Medicine

## 2018-03-07 ENCOUNTER — Ambulatory Visit: Payer: BLUE CROSS/BLUE SHIELD | Admitting: Family Medicine

## 2018-03-07 VITALS — BP 120/84 | HR 70 | Temp 98.3°F | Resp 16 | Ht 64.0 in | Wt 203.0 lb

## 2018-03-07 DIAGNOSIS — J0100 Acute maxillary sinusitis, unspecified: Secondary | ICD-10-CM | POA: Diagnosis not present

## 2018-03-07 DIAGNOSIS — J302 Other seasonal allergic rhinitis: Secondary | ICD-10-CM | POA: Diagnosis not present

## 2018-03-07 MED ORDER — FLUTICASONE PROPIONATE 50 MCG/ACT NA SUSP: 2.0000 | Freq: Every day | NASAL | 6 refills | Status: DC

## 2018-03-07 MED ORDER — CETIRIZINE HCL 10 MG PO TABS: 10.0000 mg | ORAL_TABLET | Freq: Every day | ORAL | 11 refills | Status: DC

## 2018-03-07 NOTE — Progress Notes (Signed)
Patient ID: Judith Walker, female    DOB: 06/30/1956, 62 y.o.   MRN: [id number]  PCP: Alycia Rossetti, MD  Chief Complaint  Patient presents with  . Allergies    Subjective:   Judith Walker is a 62 y.o. female, presents to clinic with CC of 2 days of clear nasal discharge, sneezing, sinus pressure, raspy voice, scratchy throat and dry cough.  Yesterday she had a lot of pressure in her sinuses worse around her eyes, nose and maxillary sinuses.  She has had some itching of her nose and eyes and her extremities that was worse last night and persistent today.  No redness or drainage from her eyes.  No ear pain or ear drainage.  Today it is a little bit better.  Patient has not tried any treatments, does not have a history of seasonal allergies.  She has not have any sick contacts.  She wanted to take some over-the-counter medicines to help with her symptoms but was afraid to take anything that would interact with her medications.  She denies any fever sweats, chills, body aches, shortness of breath, chest pain, abdominal pain, nausea, vomiting, diarrhea.   Patient Active Problem List   Diagnosis Date Noted  . GAD (generalized anxiety disorder) 10/11/2017  . Vitamin D deficiency 06/01/2016  . Grief reaction 10/15/2013  . Essential hypertension, benign 08/03/2012  . Insomnia 08/03/2012  . Obesity 08/03/2012     Prior to Admission medications   Medication Sig Start Date End Date Taking? Authorizing Provider  ALPRAZolam Duanne Moron) 0.5 MG tablet TAKE ONE TABLET EVERY 6 HOURS AS NEEDED 10/11/17  Yes [place], Modena Nunnery, MD  aspirin 81 MG tablet Take 81 mg by mouth daily.   Yes [provider]  Calcium Carbonate-Vitamin D (CALCIUM + D PO) Take by mouth daily. Reported on 01/14/2016   Yes [provider]  felodipine (PLENDIL) 10 MG 24 hr tablet TAKE ONE (1) TABLET BY MOUTH EVERY DAY 10/11/17  Yes [place], Modena Nunnery, MD  ketoconazole (NIZORAL) 2 % shampoo Apply 1 application  topically 2 (two) times a week. To affected areas 12/15/16  Yes [place], Modena Nunnery, MD  cetirizine (ZYRTEC) 10 MG tablet Take 1 tablet (10 mg total) by mouth daily. 03/07/18   Delsa Grana, PA-C  fluticasone (FLONASE) 50 MCG/ACT nasal spray Place 2 sprays into both nostrils daily. 03/07/18   Delsa Grana, PA-C     No Known Allergies   Family History  Problem Relation Age of Onset  . Hypertension Mother   . Hyperlipidemia Mother   . Heart disease Mother      Social History   Socioeconomic History  . Marital status: Married    Spouse name: Not on file  . Number of children: Not on file  . Years of education: Not on file  . Highest education level: Not on file  Occupational History  . Not on file  Social Needs  . Financial resource strain: Not on file  . Food insecurity:    Worry: Not on file    Inability: Not on file  . Transportation needs:    Medical: Not on file    Non-medical: Not on file  Tobacco Use  . Smoking status: Never Smoker  . Smokeless tobacco: Never Used  Substance and Sexual Activity  . Alcohol use: No    Alcohol/week: 0.0 oz  . Drug use: No  . Sexual activity: Not Currently  Lifestyle  . Physical activity:    Days  per week: Not on file    Minutes per session: Not on file  . Stress: Not on file  Relationships  . Social connections:    Talks on phone: Not on file    Gets together: Not on file    Attends religious service: Not on file    Active member of club or organization: Not on file    Attends meetings of clubs or organizations: Not on file    Relationship status: Not on file  . Intimate partner violence:    Fear of current or ex partner: Not on file    Emotionally abused: Not on file    Physically abused: Not on file    Forced sexual activity: Not on file  Other Topics Concern  . Not on file  Social History Narrative  . Not on file     Review of Systems  Constitutional: Negative.   HENT: Positive for congestion, postnasal drip,  rhinorrhea, sinus pressure, sinus pain and sneezing. Negative for tinnitus and trouble swallowing.   Eyes: Positive for itching. Negative for photophobia, pain, discharge, redness and visual disturbance.  Respiratory: Negative for chest tightness, shortness of breath and wheezing.   Cardiovascular: Negative.   Gastrointestinal: Negative.   Endocrine: Negative.   Genitourinary: Negative.   Musculoskeletal: Negative.   Skin: Negative.   Allergic/Immunologic: Negative.   Neurological: Negative.   Hematological: Negative.   Psychiatric/Behavioral: Negative.   All other systems reviewed and are negative.      Objective:    Vitals:   03/07/18 1152  BP: 120/84  Pulse: 70  Resp: 16  Temp: 98.3 F (36.8 C)  TempSrc: Oral  SpO2: 98%  Weight: 203 lb (92.1 kg)  Height: 5\' 4"  (1.626 m)     Physical Exam  Constitutional: She appears well-developed and well-nourished. No distress.  Well-appearing woman, no acute distress  HENT:  Head: Normocephalic and atraumatic.  Right Ear: External ear normal.  Left Ear: External ear normal.  Mouth/Throat: Oropharynx is clear and moist. No oropharyngeal exudate.  Nasal mucosa edematous, and erythematous with clear discharge, bilateral nasal turbinates boggy and pale No sinus tenderness to palpation Posterior oropharynx without edema, erythema or exudate, uvula midline Bilateral tympanic membranes normal  Eyes: Pupils are equal, round, and reactive to light. Conjunctivae are normal. Right eye exhibits no discharge. Left eye exhibits no discharge. No scleral icterus.  Neck: Normal range of motion. Neck supple. No tracheal deviation present.  Cardiovascular: Normal rate, regular rhythm, normal heart sounds and intact distal pulses. Exam reveals no gallop and no friction rub.  No murmur heard. Pulmonary/Chest: Effort normal and breath sounds normal. No stridor. No respiratory distress. She has no wheezes. She has no rales. She exhibits no tenderness.   Abdominal: Soft. Bowel sounds are normal. She exhibits no distension. There is no tenderness.  Musculoskeletal: Normal range of motion.  Lymphadenopathy:    She has no cervical adenopathy.  Neurological: She is alert. No sensory deficit. She exhibits normal muscle tone. Coordination normal.  Skin: Skin is warm and dry. Capillary refill takes less than 2 seconds. No rash noted. She is not diaphoretic. No erythema. No pallor.  Psychiatric: She has a normal mood and affect. Her behavior is normal.  Nursing note and vitals reviewed.         Assessment & Plan:   1. Seasonal allergic rhinitis, unspecified trigger Suspect baseline nasal allergies, will start antihistamine and steroid spray to help during spring season - fluticasone (FLONASE) 50 MCG/ACT nasal spray;  Place 2 sprays into both nostrils daily.  Dispense: 16 g; Refill: 6 - cetirizine (ZYRTEC) 10 MG tablet; Take 1 tablet (10 mg total) by mouth daily.  Dispense: 30 tablet; Refill: 11  2. Acute maxillary sinusitis, recurrence not specified Secondary to allergic rhinitis, currently no concern for bacterial infection, antibiotics not indicated Discussed double worsening with the patient, she will contact us if she acutely worsens or develops fever with severe facial pain.     Delsa Grana, PA-C 03/07/18 1:42 PM

## 2018-03-07 NOTE — Patient Instructions (Signed)
Take zytec at night - 10 mg, once daily  Use flonase nasal spray in the morning  If you worsen please come back to get rechecked.   Allergic Rhinitis, Adult Allergic rhinitis is an allergic reaction that affects the mucous membrane inside the nose. It causes sneezing, a runny or stuffy nose, and the feeling of mucus going down the back of the throat (postnasal drip). Allergic rhinitis can be mild to severe. There are two types of allergic rhinitis:  Seasonal. This type is also called hay fever. It happens only during certain seasons.  Perennial. This type can happen at any time of the year.  What are the causes? This condition happens when the body's defense system (immune system) responds to certain harmless substances called allergens as though they were germs.  Seasonal allergic rhinitis is triggered by pollen, which can come from grasses, trees, and weeds. Perennial allergic rhinitis may be caused by:  House dust mites.  Pet dander.  Mold spores.  What are the signs or symptoms? Symptoms of this condition include:  Sneezing.  Runny or stuffy nose (nasal congestion).  Postnasal drip.  Itchy nose.  Tearing of the eyes.  Trouble sleeping.  Daytime sleepiness.  How is this diagnosed? This condition may be diagnosed based on:  Your medical history.  A physical exam.  Tests to check for related conditions, such as: ? Asthma. ? Pink eye. ? Ear infection. ? Upper respiratory infection.  Tests to find out which allergens trigger your symptoms. These may include skin or blood tests.  How is this treated? There is no cure for this condition, but treatment can help control symptoms. Treatment may include:  Taking medicines that block allergy symptoms, such as antihistamines. Medicine may be given as a shot, nasal spray, or pill.  Avoiding the allergen.  Desensitization. This treatment involves getting ongoing shots until your body becomes less sensitive to the  allergen. This treatment may be done if other treatments do not help.  If taking medicine and avoiding the allergen does not work, new, stronger medicines may be prescribed.  Follow these instructions at home:  Find out what you are allergic to. Common allergens include smoke, dust, and pollen.  Avoid the things you are allergic to. These are some things you can do to help avoid allergens: ? Replace carpet with wood, tile, or vinyl flooring. Carpet can trap dander and dust. ? Do not smoke. Do not allow smoking in your home. ? Change your heating and air conditioning filter at least once a month. ? During allergy season:  Keep windows closed as much as possible.  Plan outdoor activities when pollen counts are lowest. This is usually during the evening hours.  When coming indoors, change clothing and shower before sitting on furniture or bedding.  Take over-the-counter and prescription medicines only as told by your health care provider.  Keep all follow-up visits as told by your health care provider. This is important. Contact a health care provider if:  You have a fever.  You develop a persistent cough.  You make whistling sounds when you breathe (you wheeze).  Your symptoms interfere with your normal daily activities. Get help right away if:  You have shortness of breath. Summary  This condition can be managed by taking medicines as directed and avoiding allergens.  Contact your health care provider if you develop a persistent cough or fever.  During allergy season, keep windows closed as much as possible. This information is not intended to  replace advice given to you by your health care provider. Make sure you discuss any questions you have with your health care provider. Document Released: 08/02/2001 Document Revised: 12/15/2016 Document Reviewed: 12/15/2016 Elsevier Interactive Patient Education  [employer].

## 2018-08-01 ENCOUNTER — Ambulatory Visit (INDEPENDENT_AMBULATORY_CARE_PROVIDER_SITE_OTHER): Payer: BLUE CROSS/BLUE SHIELD | Admitting: Family Medicine

## 2018-08-01 ENCOUNTER — Encounter: Payer: Self-pay | Admitting: Family Medicine

## 2018-08-01 ENCOUNTER — Other Ambulatory Visit: Payer: Self-pay

## 2018-08-01 VITALS — BP 128/68 | HR 76 | Temp 98.6°F | Resp 14 | Ht 64.0 in | Wt 209.0 lb

## 2018-08-01 DIAGNOSIS — Z23 Encounter for immunization: Secondary | ICD-10-CM

## 2018-08-01 DIAGNOSIS — Z6834 Body mass index (BMI) 34.0-34.9, adult: Secondary | ICD-10-CM

## 2018-08-01 DIAGNOSIS — Z1239 Encounter for other screening for malignant neoplasm of breast: Secondary | ICD-10-CM

## 2018-08-01 DIAGNOSIS — Z111 Encounter for screening for respiratory tuberculosis: Secondary | ICD-10-CM

## 2018-08-01 DIAGNOSIS — I10 Essential (primary) hypertension: Secondary | ICD-10-CM | POA: Diagnosis not present

## 2018-08-01 DIAGNOSIS — Z1159 Encounter for screening for other viral diseases: Secondary | ICD-10-CM

## 2018-08-01 DIAGNOSIS — E6609 Other obesity due to excess calories: Secondary | ICD-10-CM | POA: Diagnosis not present

## 2018-08-01 DIAGNOSIS — Z1211 Encounter for screening for malignant neoplasm of colon: Secondary | ICD-10-CM

## 2018-08-01 DIAGNOSIS — Z1231 Encounter for screening mammogram for malignant neoplasm of breast: Secondary | ICD-10-CM

## 2018-08-01 DIAGNOSIS — E559 Vitamin D deficiency, unspecified: Secondary | ICD-10-CM

## 2018-08-01 DIAGNOSIS — Z114 Encounter for screening for human immunodeficiency virus [HIV]: Secondary | ICD-10-CM

## 2018-08-01 DIAGNOSIS — E782 Mixed hyperlipidemia: Secondary | ICD-10-CM

## 2018-08-01 NOTE — Patient Instructions (Signed)
Preventing High Cholesterol Cholesterol is a waxy, fat-like substance that your body needs in small amounts. Your liver makes all the cholesterol that your body needs. Having high cholesterol (hypercholesterolemia) increases your risk for heart disease and stroke. Extra (excess) cholesterol comes from the food you eat, such as animal-based fat (saturated fat) from meat and some dairy products. High cholesterol can often be prevented with diet and lifestyle changes. If you already have high cholesterol, you can control it with diet and lifestyle changes, as well as medicine. What nutrition changes can be made?  Eat less saturated fat. Foods that contain saturated fat include red meat and some dairy products.  Avoid processed meats, like bacon and lunch meats.  Avoid trans fats, which are found in margarine and some baked goods.  Avoid foods and beverages that have added sugars.  Eat more fruits, vegetables, and whole grains.  Choose healthy sources of protein, such as fish, poultry, and nuts.  Choose healthy sources of fat, such as: ? Nuts. ? Vegetable oils, especially olive oil. ? Fish that have healthy fats (omega-3 fatty acids), such as mackerel or salmon. What lifestyle changes can be made?  Lose weight if you are overweight. Losing 5-10 lb (2.3-4.5 kg) can help prevent or control high cholesterol and reduce your risk for diabetes and high blood pressure. Ask your health care provider to help you with a diet and exercise plan to safely lose weight.  Get enough exercise. Do at least 150 minutes of moderate-intensity exercise each week. ? You could do this in short exercise sessions several times a day, or you could do longer exercise sessions a few times a week. For example, you could take a brisk 10-minute walk or bike ride, 3 times a day, for 5 days a week.  Do not smoke. If you need help quitting, ask your health care provider.  Limit your alcohol intake. If you drink alcohol,  limit alcohol intake to no more than 1 drink a day for nonpregnant women and 2 drinks a day for men. One drink equals 12 oz of beer, 5 oz of wine, or 1 oz of hard liquor. Why are these changes important? If you have high cholesterol, deposits (plaques) may build up on the walls of your blood vessels. Plaques make the arteries narrower and stiffer, which can restrict or block blood flow and cause blood clots to form. This greatly increases your risk for heart attack and stroke. Making diet and lifestyle changes can reduce your risk for these life-threatening conditions. What can I do to lower my risk?  Manage your risk factors for high cholesterol. Talk with your health care provider about all of your risk factors and how to lower your risk.  Manage other conditions that you have, such as diabetes or high blood pressure (hypertension).  Have your cholesterol checked at regular intervals.  Keep all follow-up visits as told by your health care provider. This is important. How is this treated? In addition to diet and lifestyle changes, your health care provider may recommend medicines to help lower cholesterol, such as a medicine to reduce the amount of cholesterol made in your liver. You may need medicine if:  Diet and lifestyle changes do not lower your cholesterol enough.  You have high cholesterol and other risk factors for heart disease or stroke.  Take over-the-counter and prescription medicines only as told by your health care provider. Where to find more information:  American Heart Association: www.heart.org/HEARTORG/Conditions/Cholesterol/Cholesterol_UCM_001089_SubHomePage.jsp  National Heart, Lung, and   Blood Institute: www.nhlbi.nih.gov/health/resources/heart/heart-cholesterol-hbc-what-html Summary  High cholesterol increases your risk for heart disease and stroke. By keeping your cholesterol level low, you can reduce your risk for these conditions.  Diet and lifestyle changes  are the most important steps in preventing high cholesterol.  Work with your health care provider to manage your risk factors, and have your blood tested regularly. This information is not intended to replace advice given to you by your health care provider. Make sure you discuss any questions you have with your health care provider. Document Released: 11/22/2015 Document Revised: 07/16/2016 Document Reviewed: 07/16/2016 Elsevier Interactive Patient Education  2018 [employer] Inc.  

## 2018-08-01 NOTE — Progress Notes (Signed)
Patient: Judith Walker, Female    DOB: 1956/06/18, 62 y.o.   MRN: [id number] Visit Date: 08/01/2018  Today's Provider: Delsa Grana, PA-C   Chief Complaint  Patient presents with  . CPE    is fasting  . Medical Forms    mediccal report for work- needs TB test   Subjective:    Annual physical exam Judith Walker is a 61 y.o. female who presents today for health maintenance and complete physical. She feels well. She reports exercising sometimes. She reports she is sleeping well.  ----------------------------------------------------------------- She is overdue for colonoscopy  - GI referral entered Hx of vit d deficiency, not currently supplementing Due for mammogram Does not want to do any labs today, wishes to do them and see her PCP in a few months.    She needs screening for TB for a new job and she brings    Review of Systems  Constitutional: Negative.   HENT: Negative.   Eyes: Negative.   Respiratory: Negative.   Cardiovascular: Negative.   Gastrointestinal: Negative.   Endocrine: Negative.   Genitourinary: Negative.   Musculoskeletal: Negative.   Skin: Negative.   Allergic/Immunologic: Negative.   Neurological: Negative.   Hematological: Negative.   Psychiatric/Behavioral: Negative.   All other systems reviewed and are negative.   Social History      She  reports that she has never smoked. She has never used smokeless tobacco. She reports that she does not drink alcohol or use drugs.       Social History   Socioeconomic History  . Marital status: Married    Spouse name: Not on file  . Number of children: Not on file  . Years of education: Not on file  . Highest education level: Not on file  Occupational History  . Not on file  Social Needs  . Financial resource strain: Not on file  . Food insecurity:    Worry: Not on file    Inability: Not on file  . Transportation needs:    Medical: Not on file    Non-medical: Not on file  Tobacco Use  .  Smoking status: Never Smoker  . Smokeless tobacco: Never Used  Substance and Sexual Activity  . Alcohol use: No    Alcohol/week: 0.0 standard drinks  . Drug use: No  . Sexual activity: Not Currently  Lifestyle  . Physical activity:    Days per week: Not on file    Minutes per session: Not on file  . Stress: Not on file  Relationships  . Social connections:    Talks on phone: Not on file    Gets together: Not on file    Attends religious service: Not on file    Active member of club or organization: Not on file    Attends meetings of clubs or organizations: Not on file    Relationship status: Not on file  Other Topics Concern  . Not on file  Social History Narrative  . Not on file    Past Medical History:  Diagnosis Date  . Hypertension   . Leukemia [facility]) [place]      Patient Active Problem List   Diagnosis Date Noted  . GAD (generalized anxiety disorder) 10/11/2017  . Vitamin D deficiency 06/01/2016  . Grief reaction 10/15/2013  . Essential hypertension, benign 08/03/2012  . Insomnia 08/03/2012  . Obesity 08/03/2012    Past Surgical History:  Procedure Laterality Date  .  CHOLECYSTECTOMY  1997  . TUBAL LIGATION  1987    Family History        Family Status  Relation Name Status  . Mother  Alive  . Father  Deceased  . Sister ##Sister1 Alive  . Brother ##Brother1 Alive  . Sister ##Sister2 Alive  . Brother ##Brother2 Alive  . Brother ##Brother3 Alive  . Brother ##Brother4 Alive        Her family history includes Heart disease in her mother; Hyperlipidemia in her mother; Hypertension in her mother.      No Known Allergies   Current Outpatient Medications:  .  ALPRAZolam (XANAX) 0.5 MG tablet, TAKE ONE TABLET EVERY 6 HOURS AS NEEDED, Disp: 60 tablet, Rfl: 0 .  aspirin 81 MG tablet, Take 81 mg by mouth daily., Disp: , Rfl:  .  Calcium Carbonate-Vitamin D (CALCIUM + D PO), Take by mouth daily. Reported on 01/14/2016, Disp: ,  Rfl:  .  cetirizine (ZYRTEC) 10 MG tablet, Take 1 tablet (10 mg total) by mouth daily., Disp: 30 tablet, Rfl: 11 .  felodipine (PLENDIL) 10 MG 24 hr tablet, TAKE ONE (1) TABLET BY MOUTH EVERY DAY, Disp: 90 tablet, Rfl: 3 .  fluticasone (FLONASE) 50 MCG/ACT nasal spray, Place 2 sprays into both nostrils daily., Disp: 16 g, Rfl: 6 .  ketoconazole (NIZORAL) 2 % shampoo, Apply 1 application topically 2 (two) times a week. To affected areas, Disp: 120 mL, Rfl: 2   Patient Care Team: Alycia Rossetti, MD as PCP - General (Family Medicine)      Objective:   Vitals: BP 128/68   Pulse 76   Temp 98.6 F (37 C) (Oral)   Resp 14   Ht 5\' 4"  (1.626 m)   Wt 209 lb (94.8 kg)   SpO2 97%   BMI 35.87 kg/m    Vitals:   08/01/18 1037  BP: 128/68  Pulse: 76  Resp: 14  Temp: 98.6 F (37 C)  TempSrc: Oral  SpO2: 97%  Weight: 209 lb (94.8 kg)  Height: 5\' 4"  (1.626 m)     Physical Exam  Constitutional: She appears well-developed.  HENT:  Head: Normocephalic and atraumatic.  Nose: Nose normal.  Eyes: Conjunctivae are normal. Right eye exhibits no discharge. Left eye exhibits no discharge.  Neck: No tracheal deviation present.  Cardiovascular: Normal rate and regular rhythm.  Pulmonary/Chest: Effort normal. No stridor. No respiratory distress.  Musculoskeletal: Normal range of motion.  Neurological: She is alert. She exhibits normal muscle tone. Coordination normal.  Skin: Skin is warm and dry. No rash noted.  Psychiatric: She has a normal mood and affect. Her behavior is normal.  Nursing note and vitals reviewed.    Depression Screen PHQ 2/9 Scores 08/01/2018 03/07/2018 10/11/2017  PHQ - 2 Score 0 0 0  PHQ- 9 Score - - 3   GAD-7     08/01/18 1045  08/03/18 1414        GAD-7 Over the last 2 weeks, how often have you been bothered by the following problems?  1. Feeling Nervous, Anxious, or on Edge Not at all sure Not at all sure  2. Not Being Able to Stop or Control Worrying Not  at all sure Not at all sure  3. Worrying Too Much About Different Things Not at all sure Not at all sure  4. Trouble Relaxing Not at all sure Not at all sure  5. Being So Restless it's Hard To Sit Still Not at all sure Not  at all sure  6. Becoming Easily Annoyed or Irritable Not at all sure Not at all sure  7. Feeling Afraid As If Something Awful Might Happen Not at all sure Not at all sure  Total GAD-7 Score 0 (calculated) 0 (calculated)  Anxiety Difficulty  Difficulty At Work, Home, or [place] With Others? Not difficult at all         Office Visit from 08/01/2018 in [place]  AUDIT-C Score  0       Assessment & Plan:     Routine Health Maintenance and Physical Exam  Exercise Activities and Dietary recommendations Goals   None   Good diet and more exercise  Immunization History  Administered Date(s) Administered  . Hepatitis B 08/31/1992, 09/28/1992, 03/03/1993  . Influenza,inj,Quad PF,6+ Mos 10/15/2013, 12/02/2014, 09/15/2016, 10/11/2017, 08/01/2018  . Influenza-Unspecified 09/21/2005, 10/16/2007, 10/26/2009, 09/15/2010, 10/17/2011  . PPD Test 12/02/2014  . Td 07/12/2011  . Tdap 07/12/2011    Health Maintenance  Topic Date Due  . Hepatitis C Screening  [birth year]  . HIV Screening  04/07/1971  . MAMMOGRAM  04/06/2006  . COLONOSCOPY  04/06/2006  . PAP SMEAR  02/16/2015  . TETANUS/TDAP  07/11/2021  . INFLUENZA VACCINE  Completed   Hep C, HIV ordered with future labs Mammogram and colonoscopy ordered Flu done today.  Discussed health benefits of physical activity, and encouraged her to engage in regular exercise appropriate for her age and condition.    -------------------------------------------------------------------- Problem List Items Addressed This Visit      Cardiovascular and Mediastinum   Essential hypertension, benign   Relevant Orders   CBC with Differential/Platelet   COMPLETE METABOLIC PANEL WITH GFR   Lipid panel       Other   Obesity   Relevant Orders   CBC with Differential/Platelet   COMPLETE METABOLIC PANEL WITH GFR   Lipid panel   Vitamin D deficiency   Relevant Orders   COMPLETE METABOLIC PANEL WITH GFR   VITAMIN D 25 Hydroxy (Vit-D Deficiency, Fractures)    Other Visit Diagnoses    Screening for tuberculosis    -  Primary   Relevant Orders   QuantiFERON-TB Gold Plus   Need for immunization against influenza       Relevant Orders   Flu Vaccine QUAD 36+ mos IM (Completed)   Mixed hyperlipidemia       Relevant Orders   CBC with Differential/Platelet   COMPLETE METABOLIC PANEL WITH GFR   Lipid panel   Screening for malignant neoplasm of breast       Relevant Orders   MM Digital Screening   Screening for malignant neoplasm of colon       Relevant Orders   Ambulatory referral to Gastroenterology   Encounter for screening for HIV       Relevant Orders   HIV antibody   Need for hepatitis C screening test       Relevant Orders   Hepatitis C antibody     Employment form completed and only pending Quantiferon Gold test results as of 08/03/18 2:17 PM    Delsa Grana, PA-C 08/01/18 11:05 AM  [facility]

## 2018-08-04 LAB — QUANTIFERON-TB GOLD PLUS
Mitogen-NIL: >10 IU/mL
NIL: 0.03 [IU]/mL
QUANTIFERON-TB GOLD PLUS: NEGATIVE
TB1-NIL: 0 IU/mL
TB2-NIL: 0 IU/mL

## 2018-08-05 ENCOUNTER — Encounter: Payer: Self-pay | Admitting: Family Medicine

## 2018-08-23 ENCOUNTER — Ambulatory Visit (HOSPITAL_COMMUNITY): Payer: Self-pay

## 2018-10-30 ENCOUNTER — Ambulatory Visit: Payer: BLUE CROSS/BLUE SHIELD | Admitting: Family Medicine

## 2018-11-20 ENCOUNTER — Other Ambulatory Visit: Payer: Self-pay | Admitting: Family Medicine

## 2018-11-20 ENCOUNTER — Encounter: Payer: Self-pay | Admitting: Family Medicine

## 2019-09-03 ENCOUNTER — Ambulatory Visit (INDEPENDENT_AMBULATORY_CARE_PROVIDER_SITE_OTHER): Payer: PRIVATE HEALTH INSURANCE | Admitting: Family Medicine

## 2019-09-03 ENCOUNTER — Encounter: Payer: Self-pay | Admitting: Family Medicine

## 2019-09-03 ENCOUNTER — Other Ambulatory Visit: Payer: Self-pay

## 2019-09-03 VITALS — BP 130/84 | HR 76 | Temp 98.9°F | Ht 64.0 in | Wt 213.2 lb

## 2019-09-03 DIAGNOSIS — E559 Vitamin D deficiency, unspecified: Secondary | ICD-10-CM

## 2019-09-03 DIAGNOSIS — I10 Essential (primary) hypertension: Secondary | ICD-10-CM

## 2019-09-03 DIAGNOSIS — Z23 Encounter for immunization: Secondary | ICD-10-CM | POA: Diagnosis not present

## 2019-09-03 DIAGNOSIS — M25561 Pain in right knee: Secondary | ICD-10-CM

## 2019-09-03 DIAGNOSIS — Z0000 Encounter for general adult medical examination without abnormal findings: Secondary | ICD-10-CM | POA: Diagnosis not present

## 2019-09-03 DIAGNOSIS — Z111 Encounter for screening for respiratory tuberculosis: Secondary | ICD-10-CM

## 2019-09-03 DIAGNOSIS — M25562 Pain in left knee: Secondary | ICD-10-CM

## 2019-09-03 DIAGNOSIS — F411 Generalized anxiety disorder: Secondary | ICD-10-CM

## 2019-09-03 DIAGNOSIS — Z1231 Encounter for screening mammogram for malignant neoplasm of breast: Secondary | ICD-10-CM

## 2019-09-03 DIAGNOSIS — G8929 Other chronic pain: Secondary | ICD-10-CM

## 2019-09-03 MED ORDER — FELODIPINE ER 10 MG PO TB24: ORAL_TABLET | ORAL | 3 refills | Status: DC

## 2019-09-03 MED ORDER — ALPRAZOLAM 0.5 MG PO TABS: ORAL_TABLET | ORAL | 0 refills | Status: DC

## 2019-09-03 NOTE — Patient Instructions (Addendum)
Try Tumeric or glucosamine- Chondroitin  Get the xrays done at [facility] your mammogram  We will call with lab results  Schedule with GYN for PAP Smear  F/U 6 MONTHS

## 2019-09-03 NOTE — Assessment & Plan Note (Signed)
Rare use of xanax, refilled

## 2019-09-03 NOTE — Assessment & Plan Note (Signed)
Controlled no changes 

## 2019-09-03 NOTE — Progress Notes (Signed)
Subjective:    Patient ID: Judith Walker, female    DOB: June 10, 1956, 63 y.o.   MRN: [id number]  Patient presents for Annual Exam   Pt here for CPE     Chronic knee pain- she has been using OTC creams, and motrin, left knee worse, will swell and buckle on her   athelete when she younger, track/basketball/softball for many years      She fells well     HTN- taking BP meds - felodpiine     She uses xanax as needed last script from 2018    Overdue for mammogram and PAP Smear        Dr. Rosie Fate for eye doctor    Dentist every 6 months      Due for colonoscopy     Immunizations- Flu shot due- declines shingles vaccine    She ate peanut butter sandwhich , chips/tea     Has form for work- works at [employer]:  [place]- denies fatigue, fever, weight loss,weakness, recent illness HEENT- denies eye drainage, change in vision, nasal discharge, CVS- denies chest pain, palpitations RESP- denies SOB, cough, wheeze ABD- denies N/V, change in stools, abd pain GU- denies dysuria, hematuria, dribbling, incontinence MSK- + joint pain, muscle aches, injury Neuro- denies headache, dizziness, syncope, seizure activity       Objective:    BP 130/84   Pulse 76   Temp 98.9 F (37.2 C) (Oral)   Ht 5\' 4"  (1.626 m)   Wt 213 lb 4 oz (96.7 kg)   SpO2 96%   BMI 36.60 kg/m  GEN- NAD, alert and oriented x3 HEENT- PERRL, EOMI, non injected sclera, pink conjunctiva, MMM, oropharynx clear Neck- Supple, no thyromegaly CVS- RRR, no murmur RESP-CTAB ABD-NABS,soft,NT,ND MSK- FAIR ROM bilat knee, mild crepitus, no effusion, ligaments grossly in tact  EXT- No edema Pulses- Radial, DP- 2+  FALL/AUDIT C/Depression screen negative       Assessment & Plan:      Problem List Items Addressed This Visit      Unprioritized   Essential hypertension, benign    Controlled no changes       Relevant Medications   felodipine (PLENDIL) 10 MG 24 hr tablet   GAD (generalized  anxiety disorder)    Rare use of xanax, refilled       Relevant Medications   ALPRAZolam (XANAX) 0.5 MG tablet   Obesity   Vitamin D deficiency   Relevant Orders   Vitamin D, 25-hydroxy    Other Visit Diagnoses    Routine general medical examination at a health care facility    -  Primary   CPE done, pt to schedule mammo, cologuard for colon cancer screening, schedule with GYN for PAP   Relevant Orders   CBC with Differential/Platelet   Comprehensive metabolic panel   QuantiFERON-TB Gold Plus   Hepatitis C antibody   HIV Antibody (routine testing w rflx)   Need for immunization against influenza       Relevant Orders   Flu Vaccine QUAD 36+ mos IM (Completed)   Chronic pain of right knee       Most likley OA, obtain xrays, wanted some natural supplements, can try tumeric, omega 3, glucosamine   Relevant Orders   DG Knee Complete 4 Views Right   Chronic pain of left knee       Relevant Orders   DG Knee Complete 4 Views Left   Encounter for screening mammogram for  malignant neoplasm of breast       Relevant Orders   MM 3D SCREEN BREAST BILATERAL   Screening-pulmonary TB       needed for work form for daycare, she is cleared to work otherwise   Relevant Orders   QuantiFERON-TB Gold Plus      Note: This dictation was prepared with Sales executive along with smaller Company secretary. Any transcriptional errors that result from this process are unintentional.

## 2019-09-04 ENCOUNTER — Telehealth: Payer: Self-pay | Admitting: Family Medicine

## 2019-09-04 LAB — CBC WITH DIFFERENTIAL/PLATELET
Absolute Monocytes: 574 cells/uL (ref 200–950)
Basophils Absolute: 53 cells/uL (ref 0–200)
Basophils Relative: 0.8 %
Eosinophils Absolute: 119 cells/uL (ref 15–500)
Eosinophils Relative: 1.8 %
HCT: 40.5 % (ref 35.0–45.0)
Hemoglobin: 13.3 g/dL (ref 11.7–15.5)
Lymphs Abs: 2779 cells/uL (ref 850–3900)
MCH: 29.8 pg (ref 27.0–33.0)
MCHC: 32.8 g/dL (ref 32.0–36.0)
MCV: 90.8 fL (ref 80.0–100.0)
MPV: 11.2 fL (ref 7.5–12.5)
Monocytes Relative: 8.7 %
Neutro Abs: 3076 cells/uL (ref 1500–7800)
Neutrophils Relative %: 46.6 %
Platelets: 282 10*3/uL (ref 140–400)
RBC: 4.46 10*6/uL (ref 3.80–5.10)
RDW: 12.6 % (ref 11.0–15.0)
Total Lymphocyte: 42.1 %
WBC: 6.6 10*3/uL (ref 3.8–10.8)

## 2019-09-04 LAB — COMPREHENSIVE METABOLIC PANEL
AG Ratio: 1.2 (calc) (ref 1.0–2.5)
ALT: 7 U/L (ref 6–29)
AST: 13 U/L (ref 10–35)
Albumin: 4.1 g/dL (ref 3.6–5.1)
Alkaline phosphatase (APISO): 75 U/L (ref 37–153)
BUN: 13 mg/dL (ref 7–25)
CO2: 25 mmol/L (ref 20–32)
Calcium: 9.6 mg/dL (ref 8.6–10.4)
Chloride: 103 mmol/L (ref 98–110)
Creat: 0.88 mg/dL (ref 0.50–0.99)
Globulin: 3.4 g/dL (calc) (ref 1.9–3.7)
Glucose, Bld: 73 mg/dL (ref 65–99)
Potassium: 4.2 mmol/L (ref 3.5–5.3)
Sodium: 138 mmol/L (ref 135–146)
Total Bilirubin: 0.3 mg/dL (ref 0.2–1.2)
Total Protein: 7.5 g/dL (ref 6.1–8.1)

## 2019-09-04 LAB — LIPID PANEL
Cholesterol: 181 mg/dL (ref <200)
HDL: 39 mg/dL — ABNORMAL LOW (ref >=50)
LDL Cholesterol (Calc): 121 mg/dL (calc) — ABNORMAL HIGH
Non-HDL Cholesterol (Calc): 142 mg/dL (calc) — ABNORMAL HIGH (ref <130)
Total CHOL/HDL Ratio: 4.6 (calc) (ref <5.0)
Triglycerides: 99 mg/dL (ref <150)

## 2019-09-04 LAB — HEPATITIS C ANTIBODY
Hepatitis C Ab: NONREACTIVE
SIGNAL TO CUT-OFF: 0.02 (ref <1.00)

## 2019-09-04 LAB — VITAMIN D 25 HYDROXY (VIT D DEFICIENCY, FRACTURES): Vit D, 25-Hydroxy: 27 ng/mL — ABNORMAL LOW (ref 30–100)

## 2019-09-04 LAB — HIV ANTIBODY (ROUTINE TESTING W REFLEX): HIV 1&2 Ab, 4th Generation: NONREACTIVE

## 2019-09-04 NOTE — Telephone Encounter (Signed)
Cologuard orders placed with [facility]

## 2019-09-05 ENCOUNTER — Encounter: Payer: Self-pay | Admitting: *Deleted

## 2019-09-05 LAB — QUANTIFERON-TB GOLD PLUS
Mitogen-NIL: >10 IU/mL
NIL: 0.03 IU/mL
QuantiFERON-TB Gold Plus: NEGATIVE
TB1-NIL: 0.01 IU/mL
TB2-NIL: <0 IU/mL

## 2019-09-06 ENCOUNTER — Encounter: Payer: Self-pay | Admitting: *Deleted

## 2019-09-18 ENCOUNTER — Ambulatory Visit (HOSPITAL_COMMUNITY)
Admission: RE | Admit: 2019-09-18 | Discharge: 2019-09-18 | Disposition: A | Discharge status: Home / Self Care | Payer: PRIVATE HEALTH INSURANCE | Source: Ambulatory Visit | Attending: Family Medicine | Admitting: Family Medicine

## 2019-09-18 ENCOUNTER — Other Ambulatory Visit: Payer: Self-pay

## 2019-09-18 DIAGNOSIS — M25561 Pain in right knee: Secondary | ICD-10-CM | POA: Diagnosis not present

## 2019-09-18 DIAGNOSIS — G8929 Other chronic pain: Secondary | ICD-10-CM | POA: Insufficient documentation

## 2019-09-18 DIAGNOSIS — M25562 Pain in left knee: Secondary | ICD-10-CM | POA: Insufficient documentation

## 2019-09-19 ENCOUNTER — Other Ambulatory Visit: Payer: Self-pay | Admitting: *Deleted

## 2019-09-19 DIAGNOSIS — M25562 Pain in left knee: Secondary | ICD-10-CM

## 2019-09-19 DIAGNOSIS — M170 Bilateral primary osteoarthritis of knee: Secondary | ICD-10-CM

## 2019-09-19 DIAGNOSIS — G8929 Other chronic pain: Secondary | ICD-10-CM

## 2019-09-19 DIAGNOSIS — M2340 Loose body in knee, unspecified knee: Secondary | ICD-10-CM

## 2019-10-14 ENCOUNTER — Other Ambulatory Visit: Payer: Self-pay

## 2019-10-14 ENCOUNTER — Ambulatory Visit: Payer: PRIVATE HEALTH INSURANCE | Admitting: Orthopedic Surgery

## 2019-10-14 VITALS — BP 140/86 | HR 76 | Temp 93.2°F | Ht 64.5 in | Wt 211.0 lb

## 2019-10-14 DIAGNOSIS — M170 Bilateral primary osteoarthritis of knee: Secondary | ICD-10-CM | POA: Diagnosis not present

## 2019-10-14 MED ORDER — MELOXICAM 7.5 MG PO TABS: 7.5000 mg | ORAL_TABLET | Freq: Every day | ORAL | 5 refills | Status: DC

## 2019-10-14 NOTE — Progress Notes (Signed)
[facility]  10/14/2019  Body mass index is 35.66 kg/m.   HISTORY SECTION :  Chief Complaint  Patient presents with  . New Patient (Initial Visit)    Bilateral knee pain   HPI The patient presents for evaluation of  (mild/moderate/severe/ ) mild to moderate pain, in the (right /left) right and left knee, for 3 weeks, associated with throbbing pain swelling occasional giving way popping and cracking with left greater than right knee pain.  Prior treatment over-the-counter ibuprofen   Review of Systems  All other systems reviewed and are negative.    has a past medical history of Hypertension and Leukemia ([place]) (1997).   Past Surgical History:  Procedure Laterality Date  . CHOLECYSTECTOMY  1997  . TUBAL LIGATION  1987    Body mass index is 35.66 kg/m.   No Known Allergies   Current Outpatient Medications:  .  ALPRAZolam (XANAX) 0.5 MG tablet, TAKE ONE TABLET EVERY 6 HOURS AS NEEDED, Disp: 60 tablet, Rfl: 0 .  aspirin 81 MG tablet, Take 81 mg by mouth daily., Disp: , Rfl:  .  felodipine (PLENDIL) 10 MG 24 hr tablet, TAKE ONE (1) TABLET BY MOUTH EVERY DAY, Disp: 90 tablet, Rfl: 3 .  meloxicam (MOBIC) 7.5 MG tablet, Take 1 tablet (7.5 mg total) by mouth daily., Disp: 30 tablet, Rfl: 5   PHYSICAL EXAM SECTION: 1) BP 140/86   Pulse 76   Temp (!) 93.2 F (34 C)   Ht 5' 4.5" (1.638 m)   Wt 211 lb (95.7 kg)   BMI 35.66 kg/m   Body mass index is 35.66 kg/m. General appearance: Well-developed well-nourished no gross deformities  2) Cardiovascular normal pulse and perfusion in the lower extremities normal color without edema  3) Neurologically deep tendon reflexes are equal and normal, no sensation loss or deficits no pathologic reflexes  4) Psychological: Awake alert and oriented x3 mood and affect normal  5) Skin no lacerations or ulcerations no nodularity no palpable masses, no erythema or nodularity  6) Musculoskeletal:   Right knee medial joint line  tenderness varus deformity Range of motion remains normal Strength is normal Ligaments are stable  Left knee medial joint line tenderness with varus deformity Range of motion is normal Strength is normal All ligaments are stable   MEDICAL DECISION SECTION:  Encounter Diagnosis  Name Primary?  . Primary osteoarthritis of both knees Yes    Imaging X-rays of the knee including both knees show varus alignment to the knee osteophytes joint space narrowing medially peaking of the tibial spines bilaterally with moderate disease  Plan:  (Rx., Inj., surg., Frx, MRI/CT, XR:2)  Patient education  Weight loss  Oral anti-inflammatories  Call in 30 days if no improvement doubled the dose of meloxicam  Meds ordered this encounter  Medications  . meloxicam (MOBIC) 7.5 MG tablet    Sig: Take 1 tablet (7.5 mg total) by mouth daily.    Dispense:  30 tablet    Refill:  5       11:42 AM Arther Abbott, MD  10/14/2019

## 2019-10-14 NOTE — Patient Instructions (Addendum)

## 2020-01-21 ENCOUNTER — Ambulatory Visit: Payer: Self-pay | Attending: Internal Medicine

## 2020-01-21 DIAGNOSIS — Z23 Encounter for immunization: Secondary | ICD-10-CM | POA: Insufficient documentation

## 2020-01-21 NOTE — Progress Notes (Signed)
   Covid-19 Vaccination Clinic  Name:  Judith Walker    MRN: [id number] DOB: June 15, 1956  01/21/2020  Judith Walker was observed post Covid-19 immunization for 15 minutes without incident. She was provided with Vaccine Information Sheet and instruction to access the V-Safe system.   Judith Walker was instructed to call 911 with any severe reactions post vaccine:   Marland Kitchen Difficulty breathing  . Swelling of face and throat  . A fast heartbeat  .  . A bad rash all over body  . Dizziness and weakness   Immunizations Administered    Name Date Dose VIS Date Route   Moderna COVID-19 Vaccine 01/21/2020  9:00 AM 0.5 mL 10/22/2019 Intramuscular   Manufacturer: Moderna   Lot: [id number]   [id number]

## 2020-02-18 ENCOUNTER — Ambulatory Visit: Payer: Self-pay | Attending: Internal Medicine

## 2020-02-18 DIAGNOSIS — Z23 Encounter for immunization: Secondary | ICD-10-CM

## 2020-02-18 NOTE — Progress Notes (Signed)
   Covid-19 Vaccination Clinic  Name:  SHEROLYN PEARSON    MRN: [id number] DOB: June 22, 1956  02/18/2020  Ms. Donnally was observed post Covid-19 immunization for 15 minutes without incident. She was provided with Vaccine Information Sheet and instruction to access the V-Safe system.   Ms. Cutbirth was instructed to call 911 with any severe reactions post vaccine: Marland Kitchen Difficulty breathing  . Swelling of face and throat  . A fast heartbeat  . A bad rash all over body  . Dizziness and weakness   Immunizations Administered    Name Date Dose VIS Date Route   Moderna COVID-19 Vaccine 02/18/2020  9:02 AM 0.5 mL 10/22/2019 Intramuscular   Manufacturer: Moderna   Lot: [id number]   [id number]

## 2020-03-03 ENCOUNTER — Ambulatory Visit: Payer: PRIVATE HEALTH INSURANCE | Admitting: Family Medicine

## 2020-08-04 ENCOUNTER — Telehealth: Payer: Self-pay | Admitting: Family Medicine

## 2020-08-04 NOTE — Telephone Encounter (Signed)
CB# [phone] Pt need a TB (lab) for work

## 2020-08-04 NOTE — Telephone Encounter (Signed)
She can come in for a lab appt.

## 2020-08-31 ENCOUNTER — Other Ambulatory Visit: Payer: 59

## 2020-08-31 ENCOUNTER — Other Ambulatory Visit: Payer: Self-pay

## 2020-08-31 DIAGNOSIS — Z111 Encounter for screening for respiratory tuberculosis: Secondary | ICD-10-CM

## 2020-09-01 ENCOUNTER — Ambulatory Visit (INDEPENDENT_AMBULATORY_CARE_PROVIDER_SITE_OTHER): Payer: 59 | Admitting: Family Medicine

## 2020-09-01 ENCOUNTER — Encounter: Payer: Self-pay | Admitting: Family Medicine

## 2020-09-01 VITALS — BP 128/62 | HR 96 | Temp 98.0°F | Resp 14 | Ht 64.5 in | Wt 207.0 lb

## 2020-09-01 DIAGNOSIS — F411 Generalized anxiety disorder: Secondary | ICD-10-CM | POA: Diagnosis not present

## 2020-09-01 DIAGNOSIS — Z23 Encounter for immunization: Secondary | ICD-10-CM

## 2020-09-01 DIAGNOSIS — Z0000 Encounter for general adult medical examination without abnormal findings: Secondary | ICD-10-CM

## 2020-09-01 DIAGNOSIS — Z1231 Encounter for screening mammogram for malignant neoplasm of breast: Secondary | ICD-10-CM

## 2020-09-01 DIAGNOSIS — I10 Essential (primary) hypertension: Secondary | ICD-10-CM | POA: Diagnosis not present

## 2020-09-01 DIAGNOSIS — Z6834 Body mass index (BMI) 34.0-34.9, adult: Secondary | ICD-10-CM

## 2020-09-01 DIAGNOSIS — Z1211 Encounter for screening for malignant neoplasm of colon: Secondary | ICD-10-CM

## 2020-09-01 DIAGNOSIS — Z0001 Encounter for general adult medical examination with abnormal findings: Secondary | ICD-10-CM

## 2020-09-01 DIAGNOSIS — E559 Vitamin D deficiency, unspecified: Secondary | ICD-10-CM

## 2020-09-01 DIAGNOSIS — E6609 Other obesity due to excess calories: Secondary | ICD-10-CM

## 2020-09-01 MED ORDER — ALPRAZOLAM 0.5 MG PO TABS: ORAL_TABLET | ORAL | 0 refills | Status: DC

## 2020-09-01 MED ORDER — FELODIPINE ER 10 MG PO TB24: ORAL_TABLET | ORAL | 3 refills | Status: DC

## 2020-09-01 NOTE — Assessment & Plan Note (Signed)
Check non fasting labs

## 2020-09-01 NOTE — Assessment & Plan Note (Signed)
Controlled no changes 

## 2020-09-01 NOTE — Assessment & Plan Note (Signed)
Very rare use of xanax, gets 1 script per year filled

## 2020-09-01 NOTE — Progress Notes (Signed)
   Subjective:    Patient ID: Judith Walker, female    DOB: 1956/07/20, 64 y.o.   MRN: [id number]  Patient presents for Annual Exam (is not fasting)  Pt here for CPE, medications reviewed  Cologuard to be done  Mammogram   Immunizations- due for Flu shot   TDAP/ COVID vaccine   She had small salad and 3 crackers   UTD eye doctor and Dentist    No concerns , meds reviewed  On occ uses xanax, especially around holiday of her son's death in 10/15/23  Review Of Systems:  GEN- denies fatigue, fever, weight loss,weakness, recent illness HEENT- denies eye drainage, change in vision, nasal discharge, CVS- denies chest pain, palpitations RESP- denies SOB, cough, wheeze ABD- denies N/V, change in stools, abd pain GU- denies dysuria, hematuria, dribbling, incontinence MSK- denies joint pain, muscle aches, injury Neuro- denies headache, dizziness, syncope, seizure activity       Objective:    BP 128/62   Pulse 96   Temp 98 F (36.7 C) (Temporal)   Resp 14   Ht 5' 4.5" (1.638 m)   Wt 207 lb (93.9 kg)   SpO2 98%   BMI 34.98 kg/m  GEN- NAD, alert and oriented x3 ,obese  HEENT- PERRL, EOMI, non injected sclera, pink conjunctiva, MMM, oropharynx clear Neck- Supple, no thyromegaly CVS- RRR, no murmur RESP-CTAB ABD-NABS,soft,NT,ND EXT- No edema Pulses- Radial, DP- 2+   FALL/audit c NEG PHQ9 score 4       Assessment & Plan:      Problem List Items Addressed This Visit      Unprioritized   Essential hypertension, benign    Controlled no changes       Relevant Medications   felodipine (PLENDIL) 10 MG 24 hr tablet   Other Relevant Orders   CBC with Differential/Platelet   Lipid panel   Comprehensive metabolic panel   GAD (generalized anxiety disorder)    Very rare use of xanax, gets 1 script per year filled       Relevant Medications   ALPRAZolam (XANAX) 0.5 MG tablet   Obesity    Check non fasting labs       Vitamin D deficiency   Relevant Orders    Vitamin D, 25-hydroxy    Other Visit Diagnoses    Routine general medical examination at a health care facility    -  Primary   CPE done, pt to schedule mammogram, schedule with GYN    Colon cancer screening       cologuard to be done   Encounter for screening mammogram for malignant neoplasm of breast       Relevant Orders   MM 3D SCREEN BREAST BILATERAL      Note: This dictation was prepared with Dragon dictation along with smaller phrase technology. Any transcriptional errors that result from this process are unintentional.

## 2020-09-01 NOTE — Patient Instructions (Addendum)
F/U 1 YEAR FOR physical Schedule your mammogram  cologuard to be done  We will call with results and email your TB test

## 2020-09-02 DIAGNOSIS — Z23 Encounter for immunization: Secondary | ICD-10-CM | POA: Diagnosis not present

## 2020-09-02 LAB — COMPREHENSIVE METABOLIC PANEL
AG Ratio: 1.1 (calc) (ref 1.0–2.5)
ALT: 9 U/L (ref 6–29)
AST: 15 U/L (ref 10–35)
Albumin: 4.1 g/dL (ref 3.6–5.1)
Alkaline phosphatase (APISO): 79 U/L (ref 37–153)
BUN: 12 mg/dL (ref 7–25)
CO2: 25 mmol/L (ref 20–32)
Calcium: 9.9 mg/dL (ref 8.6–10.4)
Chloride: 105 mmol/L (ref 98–110)
Creat: 0.89 mg/dL (ref 0.50–0.99)
Globulin: 3.6 g/dL (calc) (ref 1.9–3.7)
Glucose, Bld: 91 mg/dL (ref 65–99)
Potassium: 4.3 mmol/L (ref 3.5–5.3)
Sodium: 139 mmol/L (ref 135–146)
Total Bilirubin: 0.4 mg/dL (ref 0.2–1.2)
Total Protein: 7.7 g/dL (ref 6.1–8.1)

## 2020-09-02 LAB — CBC WITH DIFFERENTIAL/PLATELET
Absolute Monocytes: 561 cells/uL (ref 200–950)
Basophils Absolute: 82 cells/uL (ref 0–200)
Basophils Relative: 1.3 %
Eosinophils Absolute: 151 cells/uL (ref 15–500)
Eosinophils Relative: 2.4 %
HCT: 40.1 % (ref 35.0–45.0)
Hemoglobin: 13.5 g/dL (ref 11.7–15.5)
Lymphs Abs: 2734 cells/uL (ref 850–3900)
MCH: 30.5 pg (ref 27.0–33.0)
MCHC: 33.7 g/dL (ref 32.0–36.0)
MCV: 90.5 fL (ref 80.0–100.0)
MPV: 10.8 fL (ref 7.5–12.5)
Monocytes Relative: 8.9 %
Neutro Abs: 2772 cells/uL (ref 1500–7800)
Neutrophils Relative %: 44 %
Platelets: 289 10*3/uL (ref 140–400)
RBC: 4.43 10*6/uL (ref 3.80–5.10)
RDW: 12.6 % (ref 11.0–15.0)
Total Lymphocyte: 43.4 %
WBC: 6.3 10*3/uL (ref 3.8–10.8)

## 2020-09-02 LAB — QUANTIFERON-TB GOLD PLUS
Mitogen-NIL: >10 IU/mL
NIL: 0.02 IU/mL
QuantiFERON-TB Gold Plus: NEGATIVE
TB1-NIL: 0 IU/mL
TB2-NIL: 0 IU/mL

## 2020-09-02 LAB — LIPID PANEL
Cholesterol: 192 mg/dL (ref <200)
HDL: 44 mg/dL — ABNORMAL LOW (ref >=50)
LDL Cholesterol (Calc): 123 mg/dL (calc) — ABNORMAL HIGH
Non-HDL Cholesterol (Calc): 148 mg/dL (calc) — ABNORMAL HIGH (ref <130)
Total CHOL/HDL Ratio: 4.4 (calc) (ref <5.0)
Triglycerides: 140 mg/dL (ref <150)

## 2020-09-02 LAB — VITAMIN D 25 HYDROXY (VIT D DEFICIENCY, FRACTURES): Vit D, 25-Hydroxy: 24 ng/mL — ABNORMAL LOW (ref 30–100)

## 2020-09-03 ENCOUNTER — Telehealth: Payer: Self-pay | Admitting: *Deleted

## 2020-09-03 ENCOUNTER — Encounter: Payer: Self-pay | Admitting: *Deleted

## 2020-09-03 NOTE — Telephone Encounter (Signed)
Received verbal orders for Cologuard.   Order placed via [facility].   Cologuard (Order [id number])

## 2020-09-14 ENCOUNTER — Ambulatory Visit
Admission: EM | Admit: 2020-09-14 | Discharge: 2020-09-14 | Disposition: A | Discharge status: Home / Self Care | Payer: 59 | Attending: Family Medicine | Admitting: Family Medicine

## 2020-09-14 ENCOUNTER — Other Ambulatory Visit: Payer: Self-pay

## 2020-09-14 DIAGNOSIS — Z20822 Contact with and (suspected) exposure to covid-19: Secondary | ICD-10-CM

## 2020-09-14 DIAGNOSIS — Z1152 Encounter for screening for COVID-19: Secondary | ICD-10-CM

## 2020-09-14 DIAGNOSIS — R0981 Nasal congestion: Secondary | ICD-10-CM

## 2020-09-14 MED ORDER — AZELASTINE HCL 0.15 % NA SOLN: 2.0000 | Freq: Three times a day (TID) | NASAL | 1 refills | Status: DC | PRN

## 2020-09-14 MED ORDER — LEVOCETIRIZINE DIHYDROCHLORIDE 5 MG PO TABS: 2.5000 mg | ORAL_TABLET | Freq: Every day | ORAL | 0 refills | Status: DC

## 2020-09-14 NOTE — ED Triage Notes (Signed)
Pt presents with c/o nasal congestion and pressure since Friday

## 2020-09-14 NOTE — ED Provider Notes (Signed)
[facility]    CSN: [id number] Arrival date & time: 09/14/20  0831      History   Chief Complaint Chief Complaint  Patient presents with  . Nasal Congestion    HPI Judith Walker is a 64 y.o. female.   HPI  Judith Walker presents with symptoms of nasal congestion, postnasal drainage x3 days. Patient has been taking over-the-counter Coricidin without relief of symptoms.  She denies fever.  She is vaccinated against COVID-19 and influenza.  Denies any known sick contacts. Endorses some mild occasional cough related to postnasal drainage.  She is currently not taking any antiallergy medications.  She denies shortness of breath, weakness, or headache.    Past Medical History:  Diagnosis Date  . Hypertension   . Leukemia [facility]) [place]     Patient Active Problem List   Diagnosis Date Noted  . GAD (generalized anxiety disorder) 10/11/2017  . Vitamin D deficiency 06/01/2016  . Grief reaction 10/15/2013  . Essential hypertension, benign 08/03/2012  . Insomnia 08/03/2012  . Obesity 08/03/2012    Past Surgical History:  Procedure Laterality Date  . CHOLECYSTECTOMY  1997  . TUBAL LIGATION  1987    OB History   No obstetric history on file.      Home Medications    Prior to Admission medications   Medication Sig Start Date End Date Taking? Authorizing Provider  ALPRAZolam Duanne Moron) 0.5 MG tablet TAKE ONE TABLET EVERY 6 HOURS AS NEEDED 09/01/20   Alycia Rossetti, MD  aspirin 81 MG tablet Take 81 mg by mouth daily.    [provider]  felodipine (PLENDIL) 10 MG 24 hr tablet TAKE ONE (1) TABLET BY MOUTH EVERY DAY 09/01/20   Alycia Rossetti, MD    Family History Family History  Problem Relation Age of Onset  . Hypertension Mother   . Hyperlipidemia Mother   . Heart disease Mother     Social History Social History   Tobacco Use  . Smoking status: Never Smoker  . Smokeless tobacco: Never Used  Substance  Use Topics  . Alcohol use: No    Alcohol/week: 0.0 standard drinks  . Drug use: No     Allergies   Patient has no known allergies.   Review of Systems Review of Systems Pertinent negatives listed in HPI  Physical Exam Triage Vital Signs ED Triage Vitals  Enc Vitals Group     BP 09/14/20 0849 128/85     Pulse Rate 09/14/20 0849 91     Resp 09/14/20 0849 20     Temp 09/14/20 0849 99 F (37.2 C)     Temp src --      SpO2 09/14/20 0849 96 %     Weight --      Height --      Head Circumference --      Peak Flow --      Pain Score 09/14/20 0848 5     Pain Loc --      Pain Edu? --      Excl. in [place]? --    No data found.  Updated Vital Signs BP 128/85   Pulse 91   Temp 99 F (37.2 C)   Resp 20   SpO2 96%   Visual Acuity Right Eye Distance:   Left Eye Distance:   Bilateral Distance:    Right Eye Near:   Left Eye Near:    Bilateral Near:  Physical Exam Constitutional:      Appearance: Normal appearance.  HENT:     Head: Normocephalic.     Right Ear: Tympanic membrane normal.     Left Ear: Tympanic membrane normal.     Nose: Congestion and rhinorrhea present.     Mouth/Throat:     Mouth: Mucous membranes are moist.     Pharynx: No oropharyngeal exudate or posterior oropharyngeal erythema.  Cardiovascular:     Rate and Rhythm: Normal rate and regular rhythm.  Pulmonary:     Effort: Pulmonary effort is normal.     Breath sounds: Normal breath sounds.  Musculoskeletal:        General: Normal range of motion.     Cervical back: Normal range of motion.  Lymphadenopathy:     Cervical: No cervical adenopathy.  Skin:    General: Skin is warm and dry.  Neurological:     General: No focal deficit present.     Mental Status: She is alert.  Psychiatric:        Mood and Affect: Mood normal.        Thought Content: Thought content normal.        Judgment: Judgment normal.      UC Treatments / Results  Labs (all labs ordered are listed, but only  abnormal results are displayed) Labs Reviewed  NOVEL CORONAVIRUS, NAA    EKG   Radiology No results found.  Procedures Procedures (including critical care time)  Medications Ordered in UC Medications - No data to display  Initial Impression / Assessment and Plan / UC Course  I have reviewed the triage vital signs and the nursing notes.  Pertinent labs & imaging results that were available during my care of the patient were reviewed by me and considered in my medical decision making (see chart for details).    Treating for acute nasal congestion and postnasal drainage. See medication discharge orders below.  COVID-19 test pending.  Work note provided allowing patient to excuse from work over the next 72 hours to allow time for Covid test to result. Follow-up if symptoms worsen or do not improve. Final Clinical Impressions(s) / UC Diagnoses   Final diagnoses:  Sinus congestion  Encounter for screening laboratory testing for COVID-19 virus   Discharge Instructions   None    ED Prescriptions    Medication Sig Dispense Auth. Provider   levocetirizine (XYZAL) 5 MG tablet Take 0.5 tablets (2.5 mg total) by mouth at bedtime. 30 tablet Scot Jun, FNP   Azelastine HCl 0.15 % SOLN Place 2 sprays into the nose 3 (three) times daily as needed. 30 mL Scot Jun, FNP     PDMP not reviewed this encounter.   Scot Jun, [place] 09/14/20 [phone]

## 2020-09-15 ENCOUNTER — Encounter: Payer: Self-pay | Admitting: Family Medicine

## 2020-09-15 LAB — NOVEL CORONAVIRUS, NAA: SARS-CoV-2, NAA: DETECTED — AB

## 2020-09-15 LAB — SARS-COV-2, NAA 2 DAY TAT

## 2020-09-16 ENCOUNTER — Telehealth: Payer: Self-pay | Admitting: Nurse Practitioner

## 2020-09-16 NOTE — Telephone Encounter (Signed)
Called to Discuss with patient about Covid symptoms and the use of the monoclonal antibody infusion for those with mild to moderate Covid symptoms and at a high risk of hospitalization.     Pt appears to qualify for this infusion due to co-morbid conditions and/or a member of an at-risk group in accordance with the FDA Emergency Use Authorization. BMI>25/hypertension   Unable to reach pt. Voicemail left and My Chart message sent.   Alda Lea, NP [facility]  [phone]

## 2020-11-19 ENCOUNTER — Ambulatory Visit: Payer: 59 | Attending: Internal Medicine

## 2020-11-19 DIAGNOSIS — Z23 Encounter for immunization: Secondary | ICD-10-CM

## 2020-11-19 NOTE — Progress Notes (Signed)
   Covid-19 Vaccination Clinic  Name:  Judith Walker    MRN: [id number] DOB: 08/11/1956  11/19/2020  [name] was observed post Covid-19 immunization for 15 minutes without incident. She was provided with Vaccine Information Sheet and instruction to access the V-Safe system.   [name] was instructed to call 911 with any severe reactions post vaccine: Marland Kitchen Difficulty breathing  . Swelling of face and throat  . A fast heartbeat  . A bad rash all over body  . Dizziness and weakness   Immunizations Administered    Name Date Dose VIS Date Route   Moderna Covid-19 Booster Vaccine 11/19/2020  1:12 PM 0.25 mL 09/09/2020 Intramuscular   Manufacturer: Moderna   Lot: [id number]   NDC: 17616-073-71

## 2021-02-04 ENCOUNTER — Other Ambulatory Visit: Payer: Self-pay

## 2021-02-04 ENCOUNTER — Ambulatory Visit (INDEPENDENT_AMBULATORY_CARE_PROVIDER_SITE_OTHER): Payer: 59 | Admitting: Internal Medicine

## 2021-02-04 ENCOUNTER — Encounter: Payer: Self-pay | Admitting: Internal Medicine

## 2021-02-04 VITALS — BP 144/82 | HR 63 | Temp 98.8°F | Resp 18 | Ht 65.0 in | Wt 204.4 lb

## 2021-02-04 DIAGNOSIS — M1710 Unilateral primary osteoarthritis, unspecified knee: Secondary | ICD-10-CM | POA: Insufficient documentation

## 2021-02-04 DIAGNOSIS — F411 Generalized anxiety disorder: Secondary | ICD-10-CM | POA: Diagnosis not present

## 2021-02-04 DIAGNOSIS — C9590 Leukemia, unspecified not having achieved remission: Secondary | ICD-10-CM | POA: Insufficient documentation

## 2021-02-04 DIAGNOSIS — Z7689 Persons encountering health services in other specified circumstances: Secondary | ICD-10-CM | POA: Diagnosis not present

## 2021-02-04 DIAGNOSIS — Z6834 Body mass index (BMI) 34.0-34.9, adult: Secondary | ICD-10-CM

## 2021-02-04 DIAGNOSIS — Z856 Personal history of leukemia: Secondary | ICD-10-CM

## 2021-02-04 DIAGNOSIS — I10 Essential (primary) hypertension: Secondary | ICD-10-CM | POA: Diagnosis not present

## 2021-02-04 DIAGNOSIS — E6609 Other obesity due to excess calories: Secondary | ICD-10-CM

## 2021-02-04 NOTE — Progress Notes (Addendum)
New Patient Office Visit  Subjective:  Patient ID: Judith Walker, female    DOB: [birth year]  Age: 65 y.o. 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  She is a former patient of Dr. Buelah Manis.  Her blood pressure was elevated in the office today.  She states that she has not taken her morning dose of amlodipine.  Her blood pressure remained stable according to the previous PCP office visits.  She denies any headache, dizziness, chest pain, dyspnea or palpitations.  She has history of OA of knee, for which she takes Tylenol as needed.  She has history of anxiety, for which she takes Xanax 0.5 mg as needed.  She takes it occasionally.  She has Cologuard at her home, but she has to send it back.  She is up-to-date with COVID vaccine.  Past Medical History:  Diagnosis Date   Cancer ([place])    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   Hypertension    Leukemia [facility]) [place]     Past Surgical History:  Procedure Laterality Date   CHOLECYSTECTOMY  1997   TUBAL LIGATION  1987    Family History  Problem Relation Age of Onset   Hypertension Mother    Hyperlipidemia Mother    Heart disease Mother     Social History   Socioeconomic History   Marital status: Single    Spouse name: Not on file   Number of children: Not on file   Years of education: Not on file   Highest education level: Not on file  Occupational History   Not on file  Tobacco Use   Smoking status: Never Smoker   Smokeless tobacco: Never Used  Substance and Sexual Activity   Alcohol use: No    Alcohol/week: 0.0 standard drinks   Drug use: No   Sexual activity: Not Currently  Other Topics Concern   Not on file   Social History Narrative   Not on file   Social Determinants of Health   Financial Resource Strain: Not on file  Food Insecurity: Not on file  Transportation Needs: Not on file  Physical Activity: Not on file  Stress: Not on file  Social Connections: Not on file  Intimate Partner Violence: Not on file    ROS Review of Systems  Constitutional: Negative for chills and fever.  HENT: Negative for congestion, sinus pressure, sinus pain and sore throat.   Eyes: Negative for pain and discharge.  Respiratory: Negative for cough and shortness of breath.   Cardiovascular: Negative for chest pain and palpitations.  Gastrointestinal: Negative for abdominal pain, constipation, diarrhea, nausea and vomiting.  Endocrine: Negative for polydipsia and polyuria.  Genitourinary: Negative for dysuria and hematuria.  Musculoskeletal: Negative for neck pain and neck stiffness.  Skin: Negative for rash.  Neurological: Negative for dizziness and weakness.  Psychiatric/Behavioral: Negative for agitation and behavioral problems.    Objective:   Today's Vitals: BP (!) 144/82 (BP Location: Right Arm, Patient Position: Sitting, Cuff Size: Normal)   Pulse 63   Temp 98.8 F (37.1 C) (Oral)   Resp 18   Ht 5\' 5"  (1.651 m)   Wt 204 lb 6.4 oz (92.7 kg)  SpO2 97%   BMI 34.01 kg/m   Physical Exam Vitals reviewed.  Constitutional:      General: She is not in acute distress.    Appearance: She is not diaphoretic.  HENT:     Head: Normocephalic and atraumatic.     Nose: Nose normal.     Mouth/Throat:     Mouth: Mucous membranes are moist.  Eyes:     General: No scleral icterus.    Extraocular Movements: Extraocular movements intact.  Cardiovascular:     Rate and Rhythm: Normal rate and regular rhythm.     Pulses: Normal pulses.     Heart sounds: Normal heart sounds. No murmur heard.   Pulmonary:     Breath sounds: Normal breath sounds. No wheezing or rales.  Musculoskeletal:     Cervical  back: Neck supple. No tenderness.     Right lower leg: No edema.     Left lower leg: No edema.  Skin:    General: Skin is warm.     Findings: No rash.  Neurological:     General: No focal deficit present.     Mental Status: She is alert and oriented to person, place, and time.  Psychiatric:        Mood and Affect: Mood normal.        Behavior: Behavior normal.     Assessment & Plan:   Problem List Items Addressed This Visit       Encounter to establish care - Primary   Care established Previous chart reviewed History and medications reviewed with the patient       Cardiovascular and Mediastinum   Essential hypertension, benign    BP Readings from Last 1 Encounters:  02/04/21 (!) 144/82   Elevated today, has not had felodipine today Overall stable from previous PCP office visits Counseled for compliance with the medications Advised DASH diet and moderate exercise/walking, at least 150 mins/week         Musculoskeletal and Integument   Arthritis of knee    Takes Tylenol as needed        Other   Obesity    Diet modification and moderate exercise advised DASH diet material provided      GAD (generalized anxiety disorder)    Xanax 0.5 mg as needed, takes it occasionally      H/O leukemia    In 90s, treated at [facility] In remission               Outpatient Encounter Medications as of 02/04/2021  Medication Sig   ALPRAZolam (XANAX) 0.5 MG tablet TAKE ONE TABLET EVERY 6 HOURS AS NEEDED   aspirin 81 MG tablet Take 81 mg by mouth daily.   felodipine (PLENDIL) 10 MG 24 hr tablet TAKE ONE (1) TABLET BY MOUTH EVERY DAY   [DISCONTINUED] Azelastine HCl 0.15 % SOLN Place 2 sprays into the nose 3 (three) times daily as needed. (Patient not taking: Reported on 02/04/2021)   [DISCONTINUED] levocetirizine (XYZAL) 5 MG tablet Take 0.5 tablets (2.5 mg total) by mouth at bedtime. (Patient not taking: Reported on 02/04/2021)   No facility-administered encounter  medications on file as of 02/04/2021.    Follow-up: Return in about 7 months (around 09/06/2021) for Annual Physical.   Lindell Spar, MD

## 2021-02-04 NOTE — Assessment & Plan Note (Signed)
In 90s, treated at [facility] In remission 

## 2021-02-04 NOTE — Patient Instructions (Addendum)
Please continue taking medications as prescribed.  Please continue to follow DASH diet and perform moderate exercise/walking at least 150 mins/week.   [email or web address]  DASH Eating Plan DASH stands for Dietary Approaches to Stop Hypertension. The DASH eating plan is a healthy eating plan that has been shown to:  Reduce high blood pressure (hypertension).  Reduce your risk for type 2 diabetes, heart disease, and stroke.  Help with weight loss. What are tips for following this plan? Reading food labels  Check food labels for the amount of salt (sodium) per serving. Choose foods with less than 5 percent of the Daily Value of sodium. Generally, foods with less than 300 milligrams (mg) of sodium per serving fit into this eating plan.  To find whole grains, look for the word "whole" as the first word in the ingredient list. Shopping  Buy products labeled as "low-sodium" or "no salt added."  Buy fresh foods. Avoid canned foods and pre-made or frozen meals. Cooking  Avoid adding salt when cooking. Use salt-free seasonings or herbs instead of table salt or sea salt. Check with your health care provider or pharmacist before using salt substitutes.  Do not fry foods. Cook foods using healthy methods such as baking, boiling, grilling, roasting, and broiling instead.  Cook with heart-healthy oils, such as olive, canola, avocado, soybean, or sunflower oil. Meal planning  Eat a balanced diet that includes: ? 4 or more servings of fruits and 4 or more servings of vegetables each day. Try to fill one-half of your plate with fruits and vegetables. ? 6-8 servings of whole grains each day. ? Less than 6 oz (170 g) of lean meat, poultry, or fish each day. A 3-oz (85-g) serving of meat is about the same size as a deck of cards. One egg equals 1 oz (28 g). ? 2-3 servings of low-fat dairy each day. One serving is 1 cup (237 mL). ? 1 serving of nuts,  seeds, or beans 5 times each week. ? 2-3 servings of heart-healthy fats. Healthy fats called omega-3 fatty acids are found in foods such as walnuts, flaxseeds, fortified milks, and eggs. These fats are also found in cold-water fish, such as sardines, salmon, and mackerel.  Limit how much you eat of: ? Canned or prepackaged foods. ? Food that is high in trans fat, such as some fried foods. ? Food that is high in saturated fat, such as fatty meat. ? Desserts and other sweets, sugary drinks, and other foods with added sugar. ? Full-fat dairy products.  Do not salt foods before eating.  Do not eat more than 4 egg yolks a week.  Try to eat at least 2 vegetarian meals a week.  Eat more home-cooked food and less restaurant, buffet, and fast food.   Lifestyle  When eating at a restaurant, ask that your food be prepared with less salt or no salt, if possible.  If you drink alcohol: ? Limit how much you use to:  0-1 drink a day for women who are not pregnant.  0-2 drinks a day for men. ? Be aware of how much alcohol is in your drink. In the [place] one drink equals one 12 oz bottle of beer (355 mL), one 5 oz glass of wine (148 mL), or one 1 oz glass of hard liquor (44 mL). General information  Avoid eating more than 2,300 mg of salt a day. If you have hypertension, you may need to reduce your sodium intake to 1,500 mg a day.  Work with your health care provider to maintain a healthy body weight or to lose weight. Ask what an ideal weight is for you.  Get at least 30 minutes of exercise that causes your heart to beat faster (aerobic exercise) most days of the week. Activities may include walking, swimming, or biking.  Work with your health care provider or dietitian to adjust your eating plan to your individual calorie needs. What foods should I eat? Fruits All fresh, dried, or frozen fruit. Canned fruit in natural juice (without added sugar). Vegetables Fresh or frozen vegetables (raw,  steamed, roasted, or grilled). Low-sodium or reduced-sodium tomato and vegetable juice. Low-sodium or reduced-sodium tomato sauce and tomato paste. Low-sodium or reduced-sodium canned vegetables. Grains Whole-grain or whole-wheat bread. Whole-grain or whole-wheat pasta. Brown rice. Judith Walker. Bulgur. Whole-grain and low-sodium cereals. Pita bread. Low-fat, low-sodium crackers. Whole-wheat flour tortillas. Meats and other proteins Skinless chicken or [place]. Ground chicken or [place]. Pork with fat trimmed off. Fish and seafood. Egg whites. Dried beans, peas, or lentils. Unsalted nuts, nut butters, and seeds. Unsalted canned beans. Lean cuts of beef with fat trimmed off. Low-sodium, lean precooked or cured meat, such as sausages or meat loaves. Dairy Low-fat (1%) or fat-free (skim) milk. Reduced-fat, low-fat, or fat-free cheeses. Nonfat, low-sodium ricotta or cottage cheese. Low-fat or nonfat yogurt. Low-fat, low-sodium cheese. Fats and oils Soft margarine without trans fats. Vegetable oil. Reduced-fat, low-fat, or light mayonnaise and salad dressings (reduced-sodium). Canola, safflower, olive, avocado, soybean, and sunflower oils. Avocado. Seasonings and condiments Herbs. Spices. Seasoning mixes without salt. Other foods Unsalted popcorn and pretzels. Fat-free sweets. The items listed above may not be a complete list of foods and beverages you can eat. Contact a dietitian for more information. What foods should I avoid? Fruits Canned fruit in a light or heavy syrup. Fried fruit. Fruit in cream or butter sauce. Vegetables Creamed or fried vegetables. Vegetables in a cheese sauce. Regular canned vegetables (not low-sodium or reduced-sodium). Regular canned tomato sauce and paste (not low-sodium or reduced-sodium). Regular tomato and vegetable juice (not low-sodium or reduced-sodium). Judith Walker. Olives. Grains Baked goods made with fat, such as croissants, muffins, or some breads. Dry pasta or  rice meal packs. Meats and other proteins Fatty cuts of meat. Ribs. Fried meat. Judith Walker. Bologna, salami, and other precooked or cured meats, such as sausages or meat loaves. Fat from the back of a pig (fatback). Bratwurst. Salted nuts and seeds. Canned beans with added salt. Canned or smoked fish. Whole eggs or egg yolks. Chicken or [place] with skin. Dairy Whole or 2% milk, cream, and half-and-half. Whole or full-fat cream cheese. Whole-fat or sweetened yogurt. Full-fat cheese. Nondairy creamers. Whipped toppings. Processed cheese and cheese spreads. Fats and oils Butter. Stick margarine. Lard. Shortening. Ghee. Bacon fat. Tropical oils, such as coconut, palm kernel, or palm oil. Seasonings and condiments Onion salt, garlic salt, seasoned salt, table salt, and sea salt. Worcestershire sauce. Tartar sauce. Barbecue sauce. Teriyaki sauce. Soy sauce, including reduced-sodium. Steak sauce. Canned and packaged gravies. Fish sauce. Oyster sauce. Cocktail sauce. Store-bought horseradish. Ketchup. Mustard. Meat flavorings and tenderizers. Bouillon cubes. Hot sauces. Pre-made or packaged marinades. Pre-made or packaged taco seasonings. Relishes. Regular salad dressings. Other foods Salted popcorn and pretzels. The items listed above may not be a complete list of foods and beverages you should avoid. Contact a dietitian for more information. Where to find more information  National Heart, Lung, and Blood Institute: https://wilson-eaton.com/  American Heart Association: www.heart.org  Academy of Nutrition and  Dietetics: www.eatright.[place]: www.kidney.org Summary  The DASH eating plan is a healthy eating plan that has been shown to reduce high blood pressure (hypertension). It may also reduce your risk for type 2 diabetes, heart disease, and stroke.  When on the DASH eating plan, aim to eat more fresh fruits and vegetables, whole grains, lean proteins, low-fat dairy, and heart-healthy  fats.  With the DASH eating plan, you should limit salt (sodium) intake to 2,300 mg a day. If you have hypertension, you may need to reduce your sodium intake to 1,500 mg a day.  Work with your health care provider or dietitian to adjust your eating plan to your individual calorie needs. This information is not intended to replace advice given to you by your health care provider. Make sure you discuss any questions you have with your health care provider. Document Revised: 10/11/2019 Document Reviewed: 10/11/2019 Elsevier Patient Education  2021 [employer].

## 2021-02-04 NOTE — Assessment & Plan Note (Signed)
Xanax 0.5 mg as needed, takes it occasionally 

## 2021-02-04 NOTE — Assessment & Plan Note (Signed)
Care established Previous chart reviewed History and medications reviewed with the patient 

## 2021-02-04 NOTE — Assessment & Plan Note (Signed)
Diet modification and moderate exercise advised DASH diet material provided 

## 2021-02-04 NOTE — Assessment & Plan Note (Signed)
Takes Tylenol as needed

## 2021-02-04 NOTE — Assessment & Plan Note (Signed)
BP Readings from Last 1 Encounters:  02/04/21 (!) 144/82   Elevated today, has not had felodipine today Overall stable from previous PCP office visits Counseled for compliance with the medications Advised DASH diet and moderate exercise/walking, at least 150 mins/week

## 2021-09-07 ENCOUNTER — Other Ambulatory Visit: Payer: Self-pay | Admitting: *Deleted

## 2021-09-07 ENCOUNTER — Telehealth: Payer: Self-pay

## 2021-09-07 MED ORDER — FELODIPINE ER 10 MG PO TB24: ORAL_TABLET | ORAL | 3 refills | Status: DC

## 2021-09-07 NOTE — Telephone Encounter (Signed)
This medication has been sent to pharmacy

## 2021-09-07 NOTE — Telephone Encounter (Signed)
Patient need med refill  felodipine (PLENDIL) 10 MG 24 hr tablet   [place] [facility]

## 2021-09-09 ENCOUNTER — Encounter: Payer: 59 | Admitting: Internal Medicine

## 2021-09-13 DIAGNOSIS — Z23 Encounter for immunization: Secondary | ICD-10-CM | POA: Diagnosis not present

## 2021-09-24 IMAGING — DX DG KNEE COMPLETE 4+V*L*
4 series · 4 of 4 positions shown · non-contrast
Comparison: None.

CLINICAL DATA: Knee pain, no known injury

EXAM:
RIGHT KNEE - COMPLETE 4+ VIEW; LEFT KNEE - COMPLETE 4+ VIEW

[knee ap]
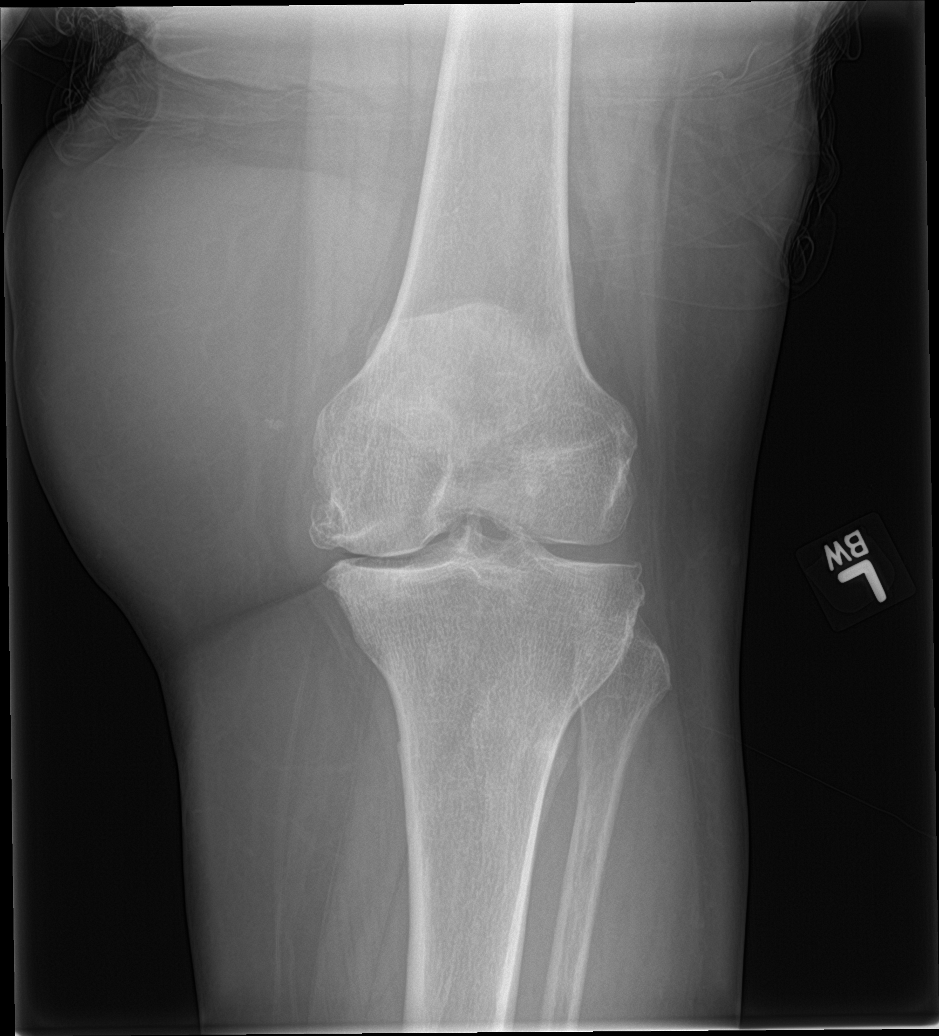

[knee obl (1 of 2)]
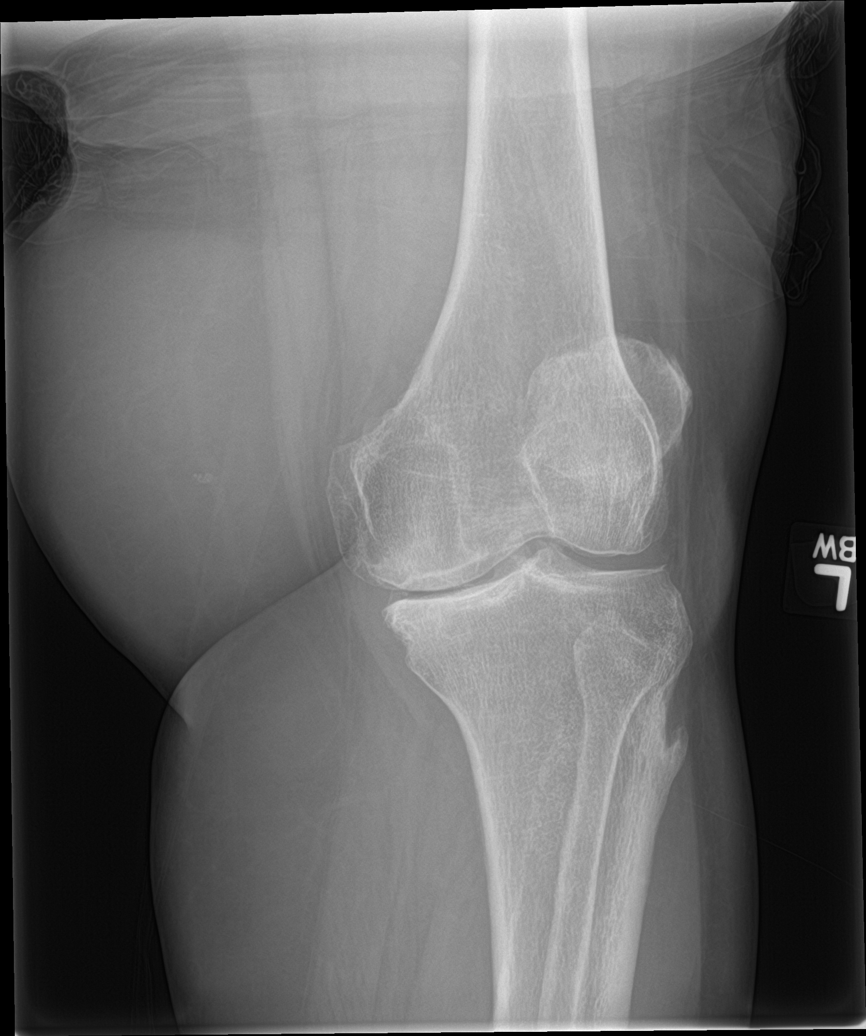

[knee obl (2 of 2)]
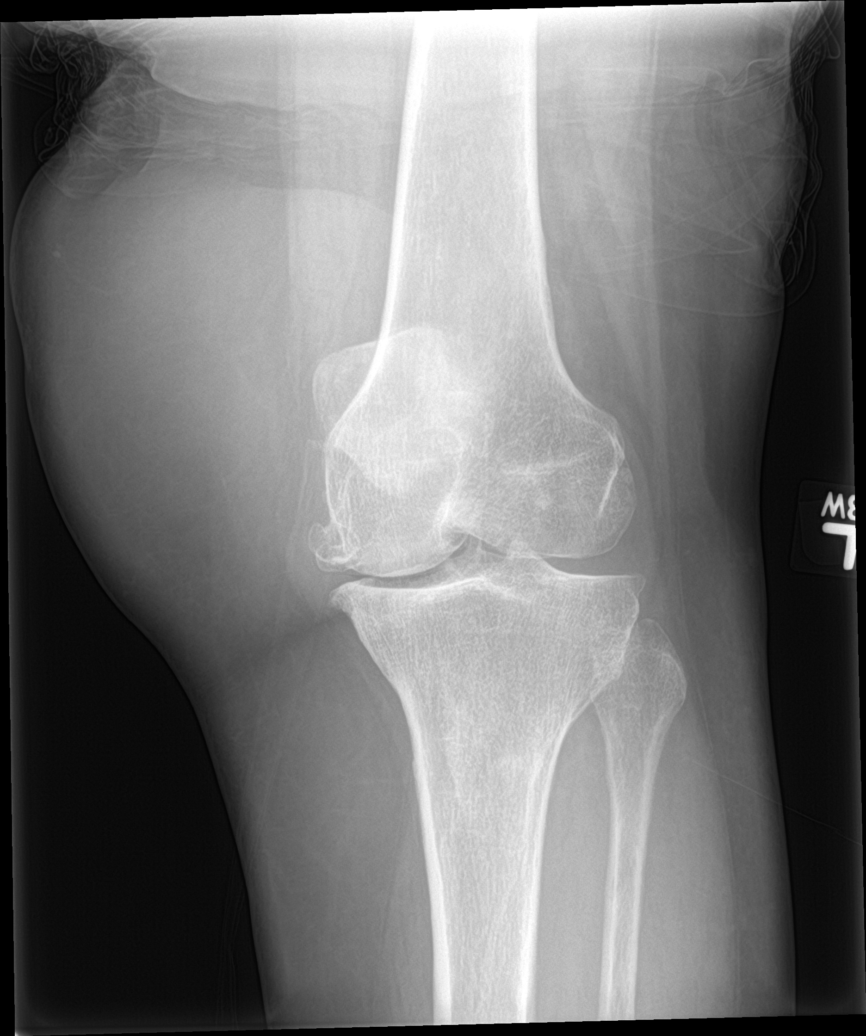

[tunnel]
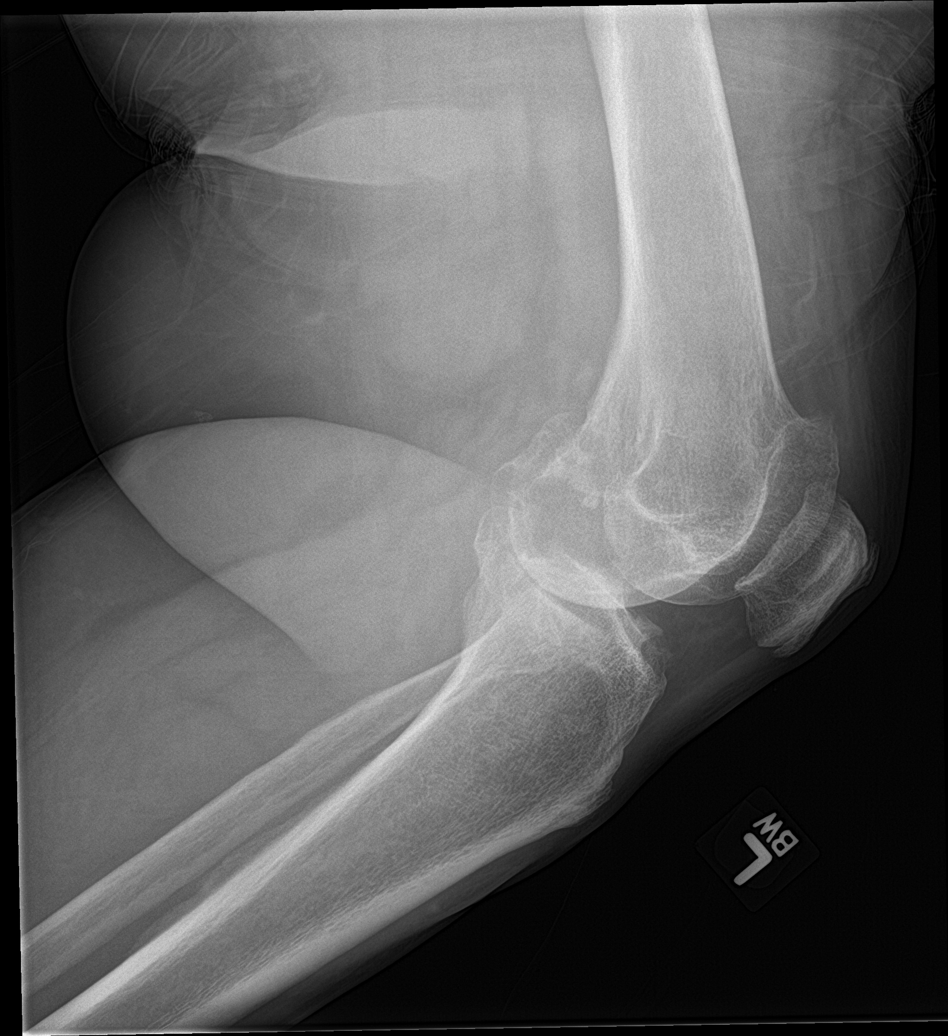

[4 of 4 positions shown; findings below may reference images not displayed]

FINDINGS: No fracture or dislocation of the bilateral knees. There is
generally moderate bilateral tricompartmental joint space narrowing
and osteophytosis, symmetric and moderate in the bilateral medial
compartments, moderate in the left patellofemoral compartment, and
asymmetrically severe in the right patellofemoral compartment. There
is relative sparing of the lateral compartments bilaterally. There
is a large, calcified loose body in the superior recess of the right
knee joint. No knee joint effusion. Soft tissues are unremarkable.
IMPRESSION: 1. No fracture or dislocation of the bilateral knees.

2. There is generally moderate bilateral tricompartmental joint
space narrowing and osteophytosis, symmetric and moderate in the
bilateral medial compartments, moderate in the left patellofemoral
compartment, and asymmetrically severe in the right patellofemoral
compartment. There is relative sparing of the lateral compartments
bilaterally.

3. There is a large, calcified loose body in the superior recess of
the right knee joint.

4.  No knee joint effusion.

## 2021-11-16 ENCOUNTER — Ambulatory Visit (INDEPENDENT_AMBULATORY_CARE_PROVIDER_SITE_OTHER): Payer: Medicare Other | Admitting: Internal Medicine

## 2021-11-16 ENCOUNTER — Other Ambulatory Visit: Payer: Self-pay

## 2021-11-16 ENCOUNTER — Encounter: Payer: Self-pay | Admitting: Internal Medicine

## 2021-11-16 VITALS — BP 118/68 | HR 91 | Resp 18 | Ht 65.0 in

## 2021-11-16 DIAGNOSIS — E559 Vitamin D deficiency, unspecified: Secondary | ICD-10-CM

## 2021-11-16 DIAGNOSIS — Z0001 Encounter for general adult medical examination with abnormal findings: Secondary | ICD-10-CM | POA: Diagnosis not present

## 2021-11-16 DIAGNOSIS — M1710 Unilateral primary osteoarthritis, unspecified knee: Secondary | ICD-10-CM | POA: Diagnosis not present

## 2021-11-16 DIAGNOSIS — I10 Essential (primary) hypertension: Secondary | ICD-10-CM

## 2021-11-16 DIAGNOSIS — Z1231 Encounter for screening mammogram for malignant neoplasm of breast: Secondary | ICD-10-CM

## 2021-11-16 DIAGNOSIS — Z1211 Encounter for screening for malignant neoplasm of colon: Secondary | ICD-10-CM

## 2021-11-16 DIAGNOSIS — Z780 Asymptomatic menopausal state: Secondary | ICD-10-CM | POA: Diagnosis not present

## 2021-11-16 MED ORDER — NAPROXEN 500 MG PO TABS: 500.0000 mg | ORAL_TABLET | Freq: Two times a day (BID) | ORAL | 2 refills | Status: DC

## 2021-11-16 NOTE — Patient Instructions (Addendum)
Please consider getting Shingrix vaccine at your local pharmacy.  You are being referred to GI.  You can stop taking Aspirin.  Please take Naproxen and Tylenol alternatively for joint pain.  Please continue to take other medications as prescribed.  Please get fasting blood tests done within a week.

## 2021-11-16 NOTE — Assessment & Plan Note (Signed)
Physical exam as documented. Counseling done  re healthy lifestyle involving commitment to 150 minutes exercise per week, heart healthy diet, and attaining healthy weight.The importance of adequate sleep also discussed. Changes in health habits are decided on by the patient with goals and time frames  set for achieving them. Immunization and cancer screening needs are specifically addressed at this visit.  Mammography and DEXA scan scheduled. Wants to wait for PCV20 for now.  Advised to get Shingrix at local pharmacy.

## 2021-11-16 NOTE — Progress Notes (Signed)
Established Patient Office Visit  Subjective:  Patient ID: Judith Walker, female    DOB: 12/17/1955  Age: 65 y.o. MRN: [id number]  CC:  Chief Complaint  Patient presents with   Annual Exam    Annual exam both knees are hurting this is arthritis she has been taking tylenol arthritis would like to know if you would recommend anything else     HPI Judith Walker is a 65 y.o. 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  HTN: BP is well-controlled. Takes medications regularly. Patient denies headache, dizziness, chest pain, dyspnea or palpitations.  She has been having b/l knee pain, for which she takes Tylenol as needed, but still has swelling and severe pain at times.  She denies any recent injury or fall.  She has seen Dr. Aline Brochure in the past and was given Mobic, but she decided not to take after reviewing its side effect profile.  She is willing to take naproxen and Tylenol alternatively for now.  She wants to wait for now for PCV20.  Past Medical History:  Diagnosis Date   Cancer ([place])    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   Hypertension    Leukemia [facility]) [place]     Past Surgical History:  Procedure Laterality Date   CHOLECYSTECTOMY  1997   TUBAL LIGATION  1987    Family History  Problem Relation Age of Onset   Hypertension Mother    Hyperlipidemia Mother    Heart disease Mother     Social History   Socioeconomic History   Marital status: Single    Spouse name: Not on file   Number of children: Not on file   Years of education: Not on file   Highest education level: Not on file  Occupational History   Not on file  Tobacco Use   Smoking status: Never   Smokeless tobacco: Never  Substance and Sexual Activity   Alcohol use: No    Alcohol/week: 0.0 standard drinks   Drug use: No   Sexual activity: Not Currently  Other Topics  Concern   Not on file  Social History Narrative   Not on file   Social Determinants of Health   Financial Resource Strain: Not on file  Food Insecurity: Not on file  Transportation Needs: Not on file  Physical Activity: Not on file  Stress: Not on file  Social Connections: Not on file  Intimate Partner Violence: Not on file    Outpatient Medications Prior to Visit  Medication Sig Dispense Refill   ALPRAZolam (XANAX) 0.5 MG tablet TAKE ONE TABLET EVERY 6 HOURS AS NEEDED 60 tablet 0   felodipine (PLENDIL) 10 MG 24 hr tablet TAKE ONE (1) TABLET BY MOUTH EVERY DAY 90 tablet 3   aspirin 81 MG tablet Take 81 mg by mouth daily.     No facility-administered medications prior to visit.    No Known Allergies  ROS Review of Systems  Constitutional:  Negative for chills and fever.  HENT:  Negative for congestion, sinus pressure, sinus pain and sore throat.   Eyes:  Negative for pain and discharge.  Respiratory:  Negative for cough and shortness of breath.   Cardiovascular:  Negative for chest pain and palpitations.  Gastrointestinal:  Negative for abdominal pain, constipation, diarrhea, nausea and vomiting.  Endocrine: Negative for polydipsia and polyuria.  Genitourinary:  Negative for dysuria and  hematuria.  Musculoskeletal:  Positive for arthralgias. Negative for neck pain and neck stiffness.  Skin:  Negative for rash.  Neurological:  Negative for dizziness and weakness.  Psychiatric/Behavioral:  Negative for agitation and behavioral problems.      Objective:    Physical Exam Vitals reviewed.  Constitutional:      General: She is not in acute distress.    Appearance: She is obese. She is not diaphoretic.  HENT:     Head: Normocephalic and atraumatic.     Nose: Nose normal.     Mouth/Throat:     Mouth: Mucous membranes are moist.  Eyes:     General: No scleral icterus.    Extraocular Movements: Extraocular movements intact.  Cardiovascular:     Rate and Rhythm: Normal  rate and regular rhythm.     Pulses: Normal pulses.     Heart sounds: Normal heart sounds. No murmur heard. Pulmonary:     Breath sounds: Normal breath sounds. No wheezing or rales.  Abdominal:     Palpations: Abdomen is soft.     Tenderness: There is no abdominal tenderness.  Musculoskeletal:     Cervical back: Neck supple. No tenderness.     Right lower leg: No edema.     Left lower leg: No edema.  Skin:    General: Skin is warm.     Findings: No rash.  Neurological:     General: No focal deficit present.     Mental Status: She is alert and oriented to person, place, and time.     Cranial Nerves: No cranial nerve deficit.     Sensory: No sensory deficit.     Motor: No weakness.  Psychiatric:        Mood and Affect: Mood normal.        Behavior: Behavior normal.    BP 118/68 (BP Location: Left Arm, Patient Position: Sitting, Cuff Size: Normal)    Pulse 91    Resp 18    Ht _0  (1.651 m)    SpO2 96%    BMI 34.01 kg/m  Wt Readings from Last 3 Encounters:  02/04/21 204 lb 6.4 oz (92.7 kg)  09/01/20 207 lb (93.9 kg)  10/14/19 211 lb (95.7 kg)    Lab Results  Component Value Date   TSH 0.603 12/02/2014   Lab Results  Component Value Date   WBC 6.3 09/01/2020   HGB 13.5 09/01/2020   HCT 40.1 09/01/2020   MCV 90.5 09/01/2020   PLT 289 09/01/2020   Lab Results  Component Value Date   NA 139 09/01/2020   K 4.3 09/01/2020   CO2 25 09/01/2020   GLUCOSE 91 09/01/2020   BUN 12 09/01/2020   CREATININE 0.89 09/01/2020   BILITOT 0.4 09/01/2020   ALKPHOS 88 12/02/2014   AST 15 09/01/2020   ALT 9 09/01/2020   PROT 7.7 09/01/2020   ALBUMIN 4.0 12/02/2014   CALCIUM 9.9 09/01/2020   Lab Results  Component Value Date   CHOL 192 09/01/2020   Lab Results  Component Value Date   HDL 44 (L) 09/01/2020   Lab Results  Component Value Date   LDLCALC 123 (H) 09/01/2020   Lab Results  Component Value Date   TRIG 140 09/01/2020   Lab Results  Component Value Date    CHOLHDL 4.4 09/01/2020   No results found for: HGBA1C    Assessment & Plan:   Problem List Items Addressed This Visit  Cardiovascular and Mediastinum   Essential hypertension, benign    BP Readings from Last 1 Encounters:  11/16/21 118/68  Well-controlled Counseled for compliance with the medications Advised DASH diet and moderate exercise/walking, at least 150 mins/week  Has been taking Aspirin for primary ppx, advised to stop taking it as she does not have any known h/o CVA or CAD.          Musculoskeletal and Integument   Arthritis of knee    Takes Tylenol as needed Started Naproxen PRN to be used alternatively with Tylenol      Relevant Medications   naproxen (NAPROSYN) 500 MG tablet     Other   Vitamin D deficiency   Relevant Orders   VITAMIN D 25 Hydroxy (Vit-D Deficiency, Fractures)   Encounter for general adult medical examination with abnormal findings - Primary    Physical exam as documented. Counseling done  re healthy lifestyle involving commitment to 150 minutes exercise per week, heart healthy diet, and attaining healthy weight.The importance of adequate sleep also discussed. Changes in health habits are decided on by the patient with goals and time frames  set for achieving them. Immunization and cancer screening needs are specifically addressed at this visit.  Mammography and DEXA scan scheduled. Wants to wait for PCV20 for now.  Advised to get Shingrix at local pharmacy.      Relevant Orders   TSH   Lipid panel   Hemoglobin A1c   CMP14+EGFR   CBC with Differential/Platelet   Other Visit Diagnoses     Screening mammogram for breast cancer       Relevant Orders   MM 3D SCREEN BREAST BILATERAL   Post-menopausal       Relevant Orders   DG Bone Density   Screen for colon cancer       Relevant Orders   Ambulatory referral to Gastroenterology       Meds ordered this encounter  Medications   naproxen (NAPROSYN) 500 MG tablet     Sig: Take 1 tablet (500 mg total) by mouth 2 (two) times daily with a meal.    Dispense:  30 tablet    Refill:  2    Follow-up: Return in about 6 months (around 05/17/2022) for HTN.    Lindell Spar, MD

## 2021-11-16 NOTE — Assessment & Plan Note (Addendum)
BP Readings from Last 1 Encounters:  11/16/21 118/68   Well-controlled Counseled for compliance with the medications Advised DASH diet and moderate exercise/walking, at least 150 mins/week  Has been taking Aspirin for primary ppx, advised to stop taking it as she does not have any known h/o CVA or CAD.

## 2021-11-16 NOTE — Assessment & Plan Note (Signed)
Takes Tylenol as needed Started Naproxen PRN to be used alternatively with Tylenol

## 2021-11-29 ENCOUNTER — Encounter: Payer: Self-pay | Admitting: Internal Medicine

## 2021-12-01 ENCOUNTER — Other Ambulatory Visit (HOSPITAL_COMMUNITY): Payer: Medicare Other

## 2021-12-01 ENCOUNTER — Ambulatory Visit (HOSPITAL_COMMUNITY): Payer: Medicare Other

## 2021-12-16 ENCOUNTER — Telehealth: Payer: Self-pay | Admitting: Internal Medicine

## 2021-12-16 NOTE — Telephone Encounter (Signed)
LVM for the PT   DUE WELCOME TO MEDICARE  Eff 03-21-2021

## 2022-01-17 ENCOUNTER — Telehealth: Payer: Self-pay | Admitting: *Deleted

## 2022-01-17 ENCOUNTER — Encounter: Payer: Self-pay | Admitting: *Deleted

## 2022-01-17 ENCOUNTER — Ambulatory Visit: Payer: Self-pay

## 2022-01-17 ENCOUNTER — Other Ambulatory Visit: Payer: Self-pay

## 2022-01-17 NOTE — Telephone Encounter (Signed)
Tried to call pt for 11:00 nurse visit x 2 times.  Had to leave a message.

## 2022-03-01 DIAGNOSIS — Z856 Personal history of leukemia: Secondary | ICD-10-CM | POA: Diagnosis not present

## 2022-03-01 DIAGNOSIS — E559 Vitamin D deficiency, unspecified: Secondary | ICD-10-CM | POA: Diagnosis not present

## 2022-03-01 DIAGNOSIS — Z0001 Encounter for general adult medical examination with abnormal findings: Secondary | ICD-10-CM | POA: Diagnosis not present

## 2022-03-01 DIAGNOSIS — I10 Essential (primary) hypertension: Secondary | ICD-10-CM | POA: Diagnosis not present

## 2022-03-02 LAB — CMP14+EGFR
ALT: 8 IU/L (ref 0–32)
AST: 13 IU/L (ref 0–40)
Albumin/Globulin Ratio: 1.4 (ref 1.2–2.2)
Albumin: 4.3 g/dL (ref 3.8–4.8)
Alkaline Phosphatase: 110 IU/L (ref 44–121)
BUN/Creatinine Ratio: 13 (ref 12–28)
BUN: 11 mg/dL (ref 8–27)
Bilirubin Total: 0.2 mg/dL (ref 0.0–1.2)
CO2: 24 mmol/L (ref 20–29)
Calcium: 9.7 mg/dL (ref 8.7–10.3)
Chloride: 102 mmol/L (ref 96–106)
Creatinine, Ser: 0.87 mg/dL (ref 0.57–1.00)
Globulin, Total: 3.1 g/dL (ref 1.5–4.5)
Glucose: 83 mg/dL (ref 70–99)
Potassium: 4.2 mmol/L (ref 3.5–5.2)
Sodium: 138 mmol/L (ref 134–144)
Total Protein: 7.4 g/dL (ref 6.0–8.5)
eGFR: 74 mL/min/{1.73_m2} (ref >59)

## 2022-03-02 LAB — CBC WITH DIFFERENTIAL/PLATELET
Basophils Absolute: 0.1 10*3/uL (ref 0.0–0.2)
Basos: 2 %
EOS (ABSOLUTE): 0.2 10*3/uL (ref 0.0–0.4)
Eos: 3 %
Hematocrit: 40.1 % (ref 34.0–46.6)
Hemoglobin: 13.2 g/dL (ref 11.1–15.9)
Immature Grans (Abs): 0 10*3/uL (ref 0.0–0.1)
Immature Granulocytes: 0 %
Lymphocytes Absolute: 2.1 10*3/uL (ref 0.7–3.1)
Lymphs: 41 %
MCH: 30.3 pg (ref 26.6–33.0)
MCHC: 32.9 g/dL (ref 31.5–35.7)
MCV: 92 fL (ref 79–97)
Monocytes Absolute: 0.4 10*3/uL (ref 0.1–0.9)
Monocytes: 8 %
Neutrophils Absolute: 2.4 10*3/uL (ref 1.4–7.0)
Neutrophils: 46 %
Platelets: 328 10*3/uL (ref 150–450)
RBC: 4.35 x10E6/uL (ref 3.77–5.28)
RDW: 12.8 % (ref 11.7–15.4)
WBC: 5.1 10*3/uL (ref 3.4–10.8)

## 2022-03-02 LAB — LIPID PANEL
Chol/HDL Ratio: 4.3 ratio (ref 0.0–4.4)
Cholesterol, Total: 182 mg/dL (ref 100–199)
HDL: 42 mg/dL (ref >39)
LDL Chol Calc (NIH): 123 mg/dL — ABNORMAL HIGH (ref 0–99)
Triglycerides: 95 mg/dL (ref 0–149)
VLDL Cholesterol Cal: 17 mg/dL (ref 5–40)

## 2022-03-02 LAB — HEMOGLOBIN A1C
Est. average glucose Bld gHb Est-mCnc: 117 mg/dL
Hgb A1c MFr Bld: 5.7 % — ABNORMAL HIGH (ref 4.8–5.6)

## 2022-03-02 LAB — TSH: TSH: 0.933 u[IU]/mL (ref 0.450–4.500)

## 2022-03-02 LAB — VITAMIN D 25 HYDROXY (VIT D DEFICIENCY, FRACTURES): Vit D, 25-Hydroxy: 11.5 ng/mL — ABNORMAL LOW (ref 30.0–100.0)

## 2022-03-03 ENCOUNTER — Encounter: Payer: Self-pay | Admitting: Internal Medicine

## 2022-03-03 ENCOUNTER — Ambulatory Visit (INDEPENDENT_AMBULATORY_CARE_PROVIDER_SITE_OTHER): Payer: Medicare Other | Admitting: Internal Medicine

## 2022-03-03 VITALS — BP 159/84 | HR 79 | Ht 65.0 in | Wt 212.0 lb

## 2022-03-03 DIAGNOSIS — I10 Essential (primary) hypertension: Secondary | ICD-10-CM

## 2022-03-03 DIAGNOSIS — Z0000 Encounter for general adult medical examination without abnormal findings: Secondary | ICD-10-CM

## 2022-03-03 NOTE — Assessment & Plan Note (Signed)
BP Readings from Last 1 Encounters:  ?03/03/22 (!) 159/84  ? ?Elevated today as she has not had her Felodipine today ?Usually well-controlled ?Counseled for compliance with the medications ?Advised DASH diet and moderate exercise/walking, at least 150 mins/week ? ?EKG: Sinus rhythm.  LAFB. No signs of active ischemia. ?

## 2022-03-03 NOTE — Progress Notes (Signed)
?  Ms. Brewton , ?Thank you for taking time to come for your Medicare Wellness Visit. I appreciate your ongoing commitment to your health goals. Please review the following plan we discussed and let me know if I can assist you in the future.  ? ?These are the goals we discussed: ? Goals   ?None ?  ?  ?This is a list of the screening recommended for you and due dates:  ?Health Maintenance  ?Topic Date Due  ? Colon Cancer Screening  Never done  ? Mammogram  Never done  ? Zoster (Shingles) Vaccine (1 of 2) Never done  ? Pap Smear  02/16/2015  ? COVID-19 Vaccine (3 - Booster for Moderna series) 01/14/2021  ? Pneumonia Vaccine (1 - PCV) Never done  ? DEXA scan (bone density measurement)  Never done  ? Tetanus Vaccine  07/11/2021  ? Flu Shot  06/21/2022  ? Hepatitis C Screening: USPSTF Recommendation to screen - Ages 65-79 yo.  Completed  ? HIV Screening  Completed  ? HPV Vaccine  Aged Out  ?  ?

## 2022-03-03 NOTE — Progress Notes (Signed)
Preventive Screening-Counseling & Management  ? ?Patient present here today for a Medicare annual wellness visit. ? ? ?Current Problems (verified)  ? ?Medications Prior to Visit Allergies (verified)  ? ?PAST HISTORY  ?Family History -  reviewed ? ?Social History  ? ? ?Risk Factors  ?Current exercise habits:   ? ?Dietary issues discussed: ? ? ?Cardiac risk factors:  ? ?Depression Screen  ?(Note: if answer to either of the following is "Yes", a more complete depression screening is indicated)  ? ?Over the past two weeks, have you felt down, depressed or hopeless? No  ?Over the past two weeks, have you felt little interest or pleasure in doing things? No  ?Have you lost interest or pleasure in daily life? No  ?Do you often feel hopeless? No  ?Do you cry easily over simple problems? No  ? ?Activities of Daily Living  ?In your present state of health, do you have any difficulty performing the following activities? ? ?Driving?: No ?Managing money?: No ?Feeding yourself?:No ?Getting from bed to chair?:No ?Climbing a flight of stairs?:No ?Preparing food and eating?:No ?Bathing or showering?:No ?Getting dressed?:No ?Getting to the toilet?:No ?Using the toilet?:No ?Moving around from place to place?: No ? ?Fall Risk Assessment ?In the past year have you fallen or had a near fall?:No ?Are you currently taking any medications that make you dizziness?:No ? ? ?Hearing Difficulties: No ?Do you often ask people to speak up or repeat themselves?:No ?Do you experience ringing or noises in your ears?:No ?Do you have difficulty understanding soft or whispered voices?:No ? ?Cognitive Testing  ?Alert? Yes Normal Appearance?Yes  ?Oriented to person? Yes Place? Yes  ?Time? Yes  ?Displays appropriate judgment?Yes  ?Can read the correct time from a watch face? yes ?Are you having problems remembering things?No ? ?Advanced Directives have been discussed with the patient?Yes  ? ? ?List the Names of Other Physician/Practitioners you currently  use: ?Outpatient Encounter Medications as of 03/03/2022  ?Medication Sig  ? ALPRAZolam (XANAX) 0.5 MG tablet TAKE ONE TABLET EVERY 6 HOURS AS NEEDED  ? felodipine (PLENDIL) 10 MG 24 hr tablet TAKE ONE (1) TABLET BY MOUTH EVERY DAY  ? naproxen (NAPROSYN) 500 MG tablet Take 1 tablet (500 mg total) by mouth 2 (two) times daily with a meal. (Patient not taking: Reported on 03/03/2022)  ? ?No facility-administered encounter medications on file as of 03/03/2022.  ? ? ? ? ?Indicate any recent Medical Services you may have received from other than [facility] providers in the past year (date may be approximate). N/A ? ? ? ?Medicare Attestation  ?I have personally reviewed:  ?The patient's medical and social history  ?Their use of alcohol, tobacco or illicit drugs  ?Their current medications and supplements  ?The patient's functional ability including ADLs,fall risks, home safety risks, cognitive, and hearing and visual impairment  ?Diet and physical activities  ?Evidence for depression or mood disorders  ?The patient's weight, height, BMI, and visual acuity have been recorded in the chart. I have made referrals, counseling, and provided education to the patient based on review of the above and I have provided the patient with a written personalized care plan for preventive services.  ? ? ?Physical Exam ?BP (!) 159/84   Pulse 79   Ht '5\' 5"'$  (1.651 m)   Wt 212 lb (96.2 kg)   SpO2 96%   BMI 35.28 kg/m?  ? ? ?Assessment & Plan:  ? ?Welcome to Medicare visit ?Preventive questionnaire reviewed ?Discussed about age-appropriate cancer screening and vaccination ?  Advised to contact GI for screening colonoscopy ?Needs to schedule mammography ?Denies pneumococcal and Shingrix vaccine for now. ? ?Essential hypertension, benign ?BP Readings from Last 1 Encounters:  ?03/03/22 (!) 159/84  ? ?Elevated today as she has not had her Felodipine today ?Usually well-controlled ?Counseled for compliance with the medications ?Advised DASH diet and  moderate exercise/walking, at least 150 mins/week ? ?EKG: Sinus rhythm.  LAFB. No signs of active ischemia. ? ?

## 2022-03-03 NOTE — Patient Instructions (Signed)
Please continue taking medications as prescribed. ? ?Please consider getting PCV20 and Shingrix vaccines. ? ?Please contact GI office to schedule appointment for colonoscopy. ? ?Please schedule Radiology department to schedule Mammography. ?

## 2022-03-31 ENCOUNTER — Other Ambulatory Visit: Payer: Self-pay | Admitting: Internal Medicine

## 2022-05-17 ENCOUNTER — Encounter: Payer: Self-pay | Admitting: Internal Medicine

## 2022-05-17 ENCOUNTER — Ambulatory Visit (INDEPENDENT_AMBULATORY_CARE_PROVIDER_SITE_OTHER): Payer: Medicare Other | Admitting: Internal Medicine

## 2022-05-17 VITALS — BP 118/82 | HR 64 | Resp 18 | Ht 65.0 in | Wt 208.4 lb

## 2022-05-17 DIAGNOSIS — F411 Generalized anxiety disorder: Secondary | ICD-10-CM

## 2022-05-17 DIAGNOSIS — Z2821 Immunization not carried out because of patient refusal: Secondary | ICD-10-CM | POA: Diagnosis not present

## 2022-05-17 DIAGNOSIS — Z5320 Procedure and treatment not carried out because of patient's decision for unspecified reasons: Secondary | ICD-10-CM

## 2022-05-17 DIAGNOSIS — E559 Vitamin D deficiency, unspecified: Secondary | ICD-10-CM | POA: Diagnosis not present

## 2022-05-17 DIAGNOSIS — M1710 Unilateral primary osteoarthritis, unspecified knee: Secondary | ICD-10-CM | POA: Diagnosis not present

## 2022-05-17 DIAGNOSIS — E6609 Other obesity due to excess calories: Secondary | ICD-10-CM

## 2022-05-17 DIAGNOSIS — I10 Essential (primary) hypertension: Secondary | ICD-10-CM

## 2022-05-17 DIAGNOSIS — Z6834 Body mass index (BMI) 34.0-34.9, adult: Secondary | ICD-10-CM

## 2022-05-17 NOTE — Assessment & Plan Note (Signed)
Xanax 0.5 mg as needed, takes it occasionally

## 2022-08-24 ENCOUNTER — Ambulatory Visit (INDEPENDENT_AMBULATORY_CARE_PROVIDER_SITE_OTHER): Payer: Medicare Other

## 2022-08-24 DIAGNOSIS — Z23 Encounter for immunization: Secondary | ICD-10-CM

## 2022-11-17 ENCOUNTER — Encounter: Payer: Medicare Other | Admitting: Internal Medicine

## 2023-01-17 ENCOUNTER — Ambulatory Visit (INDEPENDENT_AMBULATORY_CARE_PROVIDER_SITE_OTHER): Payer: Medicare Other | Admitting: Internal Medicine

## 2023-01-17 ENCOUNTER — Encounter: Payer: Self-pay | Admitting: Internal Medicine

## 2023-01-17 VITALS — BP 129/81 | HR 78 | Ht 65.0 in | Wt 205.0 lb

## 2023-01-17 DIAGNOSIS — E559 Vitamin D deficiency, unspecified: Secondary | ICD-10-CM

## 2023-01-17 DIAGNOSIS — Z0001 Encounter for general adult medical examination with abnormal findings: Secondary | ICD-10-CM | POA: Diagnosis not present

## 2023-01-17 DIAGNOSIS — E782 Mixed hyperlipidemia: Secondary | ICD-10-CM

## 2023-01-17 DIAGNOSIS — C9591 Leukemia, unspecified, in remission: Secondary | ICD-10-CM | POA: Diagnosis not present

## 2023-01-17 DIAGNOSIS — I10 Essential (primary) hypertension: Secondary | ICD-10-CM

## 2023-01-17 DIAGNOSIS — M1710 Unilateral primary osteoarthritis, unspecified knee: Secondary | ICD-10-CM | POA: Diagnosis not present

## 2023-01-17 DIAGNOSIS — F411 Generalized anxiety disorder: Secondary | ICD-10-CM

## 2023-01-17 DIAGNOSIS — Z1211 Encounter for screening for malignant neoplasm of colon: Secondary | ICD-10-CM

## 2023-01-17 DIAGNOSIS — R7303 Prediabetes: Secondary | ICD-10-CM

## 2023-01-17 MED ORDER — FELODIPINE ER 10 MG PO TB24: 10.0000 mg | ORAL_TABLET | Freq: Every day | ORAL | 3 refills | Status: DC

## 2023-01-17 MED ORDER — NAPROXEN 500 MG PO TABS: 500.0000 mg | ORAL_TABLET | Freq: Two times a day (BID) | ORAL | 0 refills | Status: DC

## 2023-01-17 NOTE — Assessment & Plan Note (Signed)
Lab Results  Component Value Date   HGBA1C 5.7 (H) 03/01/2022   Advised to follow DASH diet for now

## 2023-01-17 NOTE — Progress Notes (Signed)
Established Patient Office Visit  Subjective:  Patient ID: Judith Walker, female    DOB: 22-Apr-1956  Age: 67 y.o. MRN: [id number]  CC:  Chief Complaint  Patient presents with   Annual Exam    HPI Judith Walker is a 67 y.o. 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  HTN: BP is well-controlled. Takes medications regularly. Patient denies headache, dizziness, chest pain, dyspnea or palpitations.   She has been having b/l knee pain, for which she takes Tylenol as needed, but still has swelling and severe pain at times.  She denies any recent injury or fall.  She has seen Dr. Aline Brochure in the past. She did not like side effects of Mobic and Naproxen on the paper. After discussion, she agrees to try Naproxen as PRN.  She wants to wait for now for PCV20.   Past Medical History:  Diagnosis Date   Cancer ([place])    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   Hypertension    Leukemia [facility]) [place]     Past Surgical History:  Procedure Laterality Date   CHOLECYSTECTOMY  1997   TUBAL LIGATION  1987    Family History  Problem Relation Age of Onset   Hypertension Mother    Hyperlipidemia Mother    Heart disease Mother     Social History   Socioeconomic History   Marital status: Single    Spouse name: Not on file   Number of children: Not on file   Years of education: Not on file   Highest education level: Not on file  Occupational History   Not on file  Tobacco Use   Smoking status: Never   Smokeless tobacco: Never  Substance and Sexual Activity   Alcohol use: No    Alcohol/week: 0.0 standard drinks of alcohol   Drug use: No   Sexual activity: Not Currently  Other Topics Concern   Not on file  Social History Narrative   Not on file   Social Determinants of Health   Financial Resource Strain: Low Risk  (03/03/2022)    Overall Financial Resource Strain (CARDIA)    Difficulty of Paying Living Expenses: Not hard at all  Food Insecurity: No Food Insecurity (03/03/2022)   Hunger Vital Sign    Worried About Running Out of Food in the Last Year: Never true    [place] in the Last Year: Never true  Transportation Needs: No Transportation Needs (03/03/2022)   PRAPARE - Hydrologist (Medical): No    Lack of Transportation (Non-Medical): No  Physical Activity: Insufficiently Active (03/03/2022)   Exercise Vital Sign    Days of Exercise per Week: 2 days    Minutes of Exercise per Session: 60 min  Stress: No Stress Concern Present (03/03/2022)   [place]    Feeling of Stress : Not at all  Social Connections: Moderately Integrated (03/03/2022)   Social Connection and Isolation Panel [NHANES]    Frequency of Communication with Friends and Family: More than three times a week    Frequency of Social Gatherings with Friends and Family: More than three times a week    Attends Religious Services: More than 4 times per year    Active Member of [employer] or Organizations: Yes    Attends Archivist  Meetings: More than 4 times per year    Marital Status: Divorced  Intimate Partner Violence: Not At Risk (03/03/2022)   Humiliation, Afraid, Rape, and Kick questionnaire    Fear of Current or Ex-Partner: No    Emotionally Abused: No    Physically Abused: No    Sexually Abused: No    Outpatient Medications Prior to Visit  Medication Sig Dispense Refill   ALPRAZolam (XANAX) 0.5 MG tablet TAKE ONE TABLET EVERY 6 HOURS AS NEEDED 60 tablet 0   felodipine (PLENDIL) 10 MG 24 hr tablet Take 1 tablet (10 mg total) by mouth daily. TAKE 1 TABLET BY MOUTH DAILY 90 tablet 3   No facility-administered medications prior to visit.    No Known Allergies  ROS Review of Systems  Constitutional:  Negative for chills and fever.   HENT:  Negative for congestion, sinus pressure, sinus pain and sore throat.   Eyes:  Negative for pain and discharge.  Respiratory:  Negative for cough and shortness of breath.   Cardiovascular:  Negative for chest pain and palpitations.  Gastrointestinal:  Negative for abdominal pain, constipation, diarrhea, nausea and vomiting.  Endocrine: Negative for polydipsia and polyuria.  Genitourinary:  Negative for dysuria and hematuria.  Musculoskeletal:  Positive for arthralgias. Negative for neck pain and neck stiffness.  Skin:  Negative for rash.  Neurological:  Negative for dizziness and weakness.  Psychiatric/Behavioral:  Negative for agitation and behavioral problems.       Objective:    Physical Exam Vitals reviewed.  Constitutional:      General: She is not in acute distress.    Appearance: She is obese. She is not diaphoretic.  HENT:     Head: Normocephalic and atraumatic.     Nose: Nose normal.     Mouth/Throat:     Mouth: Mucous membranes are moist.  Eyes:     General: No scleral icterus.    Extraocular Movements: Extraocular movements intact.  Neck:     Vascular: No carotid bruit.  Cardiovascular:     Rate and Rhythm: Normal rate and regular rhythm.     Pulses: Normal pulses.     Heart sounds: Normal heart sounds. No murmur heard. Pulmonary:     Breath sounds: Normal breath sounds. No wheezing or rales.  Abdominal:     Palpations: Abdomen is soft.     Tenderness: There is no abdominal tenderness.  Musculoskeletal:     Cervical back: Neck supple. No tenderness.     Right lower leg: No edema.     Left lower leg: No edema.  Skin:    General: Skin is warm.     Findings: No rash.  Neurological:     General: No focal deficit present.     Mental Status: She is alert and oriented to person, place, and time.     Cranial Nerves: No cranial nerve deficit.     Sensory: No sensory deficit.     Motor: No weakness.  Psychiatric:        Mood and Affect: Mood normal.         Behavior: Behavior normal.     BP 129/81 (BP Location: Right Arm, Patient Position: Sitting, Cuff Size: Large)   Pulse 78   Ht '5\' 5"'$  (1.651 m)   Wt 205 lb (93 kg)   SpO2 95%   BMI 34.11 kg/m  Wt Readings from Last 3 Encounters:  01/17/23 205 lb (93 kg)  05/17/22 208 lb 6.4 oz (94.5 kg)  03/03/22 212 lb (96.2 kg)    Lab Results  Component Value Date   TSH 0.933 03/01/2022   Lab Results  Component Value Date   WBC 5.1 03/01/2022   HGB 13.2 03/01/2022   HCT 40.1 03/01/2022   MCV 92 03/01/2022   PLT 328 03/01/2022   Lab Results  Component Value Date   NA 138 03/01/2022   K 4.2 03/01/2022   CO2 24 03/01/2022   GLUCOSE 83 03/01/2022   BUN 11 03/01/2022   CREATININE 0.87 03/01/2022   BILITOT 0.2 03/01/2022   ALKPHOS 110 03/01/2022   AST 13 03/01/2022   ALT 8 03/01/2022   PROT 7.4 03/01/2022   ALBUMIN 4.3 03/01/2022   CALCIUM 9.7 03/01/2022   EGFR 74 03/01/2022   Lab Results  Component Value Date   CHOL 182 03/01/2022   Lab Results  Component Value Date   HDL 42 03/01/2022   Lab Results  Component Value Date   LDLCALC 123 (H) 03/01/2022   Lab Results  Component Value Date   TRIG 95 03/01/2022   Lab Results  Component Value Date   CHOLHDL 4.3 03/01/2022   Lab Results  Component Value Date   HGBA1C 5.7 (H) 03/01/2022      Assessment & Plan:   Problem List Items Addressed This Visit       Cardiovascular and Mediastinum   Essential hypertension, benign    BP Readings from Last 1 Encounters:  01/17/23 129/81  Usually well-controlled with Felodipine Counseled for compliance with the medications Advised DASH diet and moderate exercise/walking, at least 150 mins/week      Relevant Medications   felodipine (PLENDIL) 10 MG 24 hr tablet   Other Relevant Orders   TSH   CMP14+EGFR   CBC with Differential/Platelet     Musculoskeletal and Integument   Arthritis of knee    Takes Tylenol as needed Added Naproxen PRN      Relevant  Medications   naproxen (NAPROSYN) 500 MG tablet     Other   Vitamin D deficiency   Relevant Orders   VITAMIN D 25 Hydroxy (Vit-D Deficiency, Fractures)   GAD (generalized anxiety disorder)    Xanax 0.5 mg as needed, takes it occasionally      Leukemia ([place])    In 90s, treated at [facility] In remission      Relevant Medications   naproxen (NAPROSYN) 500 MG tablet   Other Relevant Orders   CBC with Differential/Platelet   Encounter for general adult medical examination with abnormal findings - Primary    Physical exam as documented. Counseling done  re healthy lifestyle involving commitment to 150 minutes exercise per week, heart healthy diet, and attaining healthy weight.The importance of adequate sleep also discussed. Changes in health habits are decided on by the patient with goals and time frames  set for achieving them. Immunization and cancer screening needs are specifically addressed at this visit.  Mammography - she states that she will schedule by calling and DEXA scan scheduled. Wants to wait for PCV20 for now.  Advised to get Shingrix at local pharmacy.      Prediabetes    Lab Results  Component Value Date   HGBA1C 5.7 (H) 03/01/2022  Advised to follow DASH diet for now      Relevant Orders   Hemoglobin A1c   CMP14+EGFR   Screening for colon cancer    Discussed about colonoscopy and cologuard - benefits of each procedure discussed. She prefers Cologuard - ordered.  Relevant Orders   Cologuard   Other Visit Diagnoses     Mixed hyperlipidemia       Relevant Medications   felodipine (PLENDIL) 10 MG 24 hr tablet   Other Relevant Orders   Lipid panel       Meds ordered this encounter  Medications   felodipine (PLENDIL) 10 MG 24 hr tablet    Sig: Take 1 tablet (10 mg total) by mouth daily.    Dispense:  90 tablet    Refill:  3    Requesting 1 year supply   naproxen (NAPROSYN) 500 MG tablet    Sig: Take 1 tablet (500 mg total) by mouth 2  (two) times daily with a meal.    Dispense:  30 tablet    Refill:  0    Follow-up: Return in about 6 months (around 07/18/2023) for HTN.    Lindell Spar, MD

## 2023-01-17 NOTE — Assessment & Plan Note (Signed)
Takes Tylenol as needed Added Naproxen PRN

## 2023-01-17 NOTE — Assessment & Plan Note (Signed)
BP Readings from Last 1 Encounters:  01/17/23 129/81   Usually well-controlled with Felodipine Counseled for compliance with the medications Advised DASH diet and moderate exercise/walking, at least 150 mins/week

## 2023-01-17 NOTE — Assessment & Plan Note (Signed)
Discussed about colonoscopy and cologuard - benefits of each procedure discussed. She prefers Cologuard - ordered.

## 2023-01-17 NOTE — Assessment & Plan Note (Addendum)
Physical exam as documented. Counseling done  re healthy lifestyle involving commitment to 150 minutes exercise per week, heart healthy diet, and attaining healthy weight.The importance of adequate sleep also discussed. Changes in health habits are decided on by the patient with goals and time frames  set for achieving them. Immunization and cancer screening needs are specifically addressed at this visit.  Mammography - she states that she will schedule by calling and DEXA scan scheduled. Wants to wait for PCV20 for now.  Advised to get Shingrix at local pharmacy.

## 2023-01-17 NOTE — Patient Instructions (Signed)
Please continue to take medications as prescribed.  Please continue to follow low carb diet and perform moderate exercise/walking at least 150 mins/week.  Please consider getting Shingrix and Tdap vaccines at local pharmacy.

## 2023-01-17 NOTE — Assessment & Plan Note (Signed)
Xanax 0.5 mg as needed, takes it occasionally

## 2023-01-17 NOTE — Assessment & Plan Note (Signed)
In 72s, treated at [facility] In remission

## 2023-01-18 ENCOUNTER — Other Ambulatory Visit: Payer: Self-pay

## 2023-01-18 DIAGNOSIS — M810 Age-related osteoporosis without current pathological fracture: Secondary | ICD-10-CM

## 2023-01-18 LAB — CBC WITH DIFFERENTIAL/PLATELET
Basophils Absolute: 0.1 10*3/uL (ref 0.0–0.2)
Basos: 1 %
EOS (ABSOLUTE): 0.1 10*3/uL (ref 0.0–0.4)
Eos: 3 %
Hematocrit: 43.3 % (ref 34.0–46.6)
Hemoglobin: 13.9 g/dL (ref 11.1–15.9)
Immature Grans (Abs): 0 10*3/uL (ref 0.0–0.1)
Immature Granulocytes: 0 %
Lymphocytes Absolute: 2.8 10*3/uL (ref 0.7–3.1)
Lymphs: 51 %
MCH: 29.4 pg (ref 26.6–33.0)
MCHC: 32.1 g/dL (ref 31.5–35.7)
MCV: 92 fL (ref 79–97)
Monocytes Absolute: 0.5 10*3/uL (ref 0.1–0.9)
Monocytes: 8 %
Neutrophils Absolute: 2 10*3/uL (ref 1.4–7.0)
Neutrophils: 37 %
Platelets: 310 10*3/uL (ref 150–450)
RBC: 4.72 x10E6/uL (ref 3.77–5.28)
RDW: 12.9 % (ref 11.7–15.4)
WBC: 5.5 10*3/uL (ref 3.4–10.8)

## 2023-01-18 LAB — HEMOGLOBIN A1C
Est. average glucose Bld gHb Est-mCnc: 120 mg/dL
Hgb A1c MFr Bld: 5.8 % — ABNORMAL HIGH (ref 4.8–5.6)

## 2023-01-18 LAB — CMP14+EGFR
ALT: 10 IU/L (ref 0–32)
AST: 15 IU/L (ref 0–40)
Albumin/Globulin Ratio: 1.3 (ref 1.2–2.2)
Albumin: 4.5 g/dL (ref 3.9–4.9)
Alkaline Phosphatase: 104 IU/L (ref 44–121)
BUN/Creatinine Ratio: 11 — ABNORMAL LOW (ref 12–28)
BUN: 11 mg/dL (ref 8–27)
Bilirubin Total: 0.3 mg/dL (ref 0.0–1.2)
CO2: 22 mmol/L (ref 20–29)
Calcium: 9.7 mg/dL (ref 8.7–10.3)
Chloride: 101 mmol/L (ref 96–106)
Creatinine, Ser: 0.97 mg/dL (ref 0.57–1.00)
Globulin, Total: 3.6 g/dL (ref 1.5–4.5)
Glucose: 78 mg/dL (ref 70–99)
Potassium: 4.3 mmol/L (ref 3.5–5.2)
Sodium: 137 mmol/L (ref 134–144)
Total Protein: 8.1 g/dL (ref 6.0–8.5)
eGFR: 64 mL/min/{1.73_m2} (ref >59)

## 2023-01-18 LAB — LIPID PANEL
Chol/HDL Ratio: 4.4 ratio (ref 0.0–4.4)
Cholesterol, Total: 186 mg/dL (ref 100–199)
HDL: 42 mg/dL (ref >39)
LDL Chol Calc (NIH): 120 mg/dL — ABNORMAL HIGH (ref 0–99)
Triglycerides: 132 mg/dL (ref 0–149)
VLDL Cholesterol Cal: 24 mg/dL (ref 5–40)

## 2023-01-18 LAB — VITAMIN D 25 HYDROXY (VIT D DEFICIENCY, FRACTURES): Vit D, 25-Hydroxy: 16.3 ng/mL — ABNORMAL LOW (ref 30.0–100.0)

## 2023-01-18 LAB — TSH: TSH: 0.541 u[IU]/mL (ref 0.450–4.500)

## 2023-01-25 ENCOUNTER — Ambulatory Visit (HOSPITAL_COMMUNITY)
Admission: RE | Admit: 2023-01-25 | Discharge: 2023-01-25 | Disposition: A | Discharge status: Home / Self Care | Payer: Medicare Other | Source: Ambulatory Visit | Attending: Internal Medicine | Admitting: Internal Medicine

## 2023-01-25 DIAGNOSIS — M810 Age-related osteoporosis without current pathological fracture: Secondary | ICD-10-CM | POA: Insufficient documentation

## 2023-01-25 DIAGNOSIS — M8589 Other specified disorders of bone density and structure, multiple sites: Secondary | ICD-10-CM | POA: Diagnosis not present

## 2023-01-25 DIAGNOSIS — Z780 Asymptomatic menopausal state: Secondary | ICD-10-CM | POA: Diagnosis not present

## 2023-03-06 ENCOUNTER — Encounter: Payer: Self-pay | Admitting: Internal Medicine

## 2023-03-06 ENCOUNTER — Ambulatory Visit (INDEPENDENT_AMBULATORY_CARE_PROVIDER_SITE_OTHER): Payer: Medicare Other | Admitting: Internal Medicine

## 2023-03-06 DIAGNOSIS — Z0000 Encounter for general adult medical examination without abnormal findings: Secondary | ICD-10-CM | POA: Diagnosis not present

## 2023-03-06 NOTE — Progress Notes (Signed)
Subjective:  This is a telephone encounter between Judith Walker and Judith Walker on 03/06/2023 for AWV. The visit was conducted with the patient located at home and Judith Walker at [facility]. The patient's identity was confirmed using their DOB and current address. The patient has consented to being evaluated through a telephone encounter and understands the associated risks (an examination cannot be done and the patient may need to come in for an appointment) / benefits (allows the patient to remain at home, decreasing exposure to coronavirus).     Judith Walker is a 67 y.o. female who presents for an Initial Medicare Annual Wellness Visit.  Review of Systems    Review of Systems  All other systems reviewed and are negative.    Objective:    There were no vitals filed for this visit. There is no height or weight on file to calculate BMI.     03/06/2023    3:28 PM 03/03/2022   11:44 AM  Advanced Directives  Does Patient Have a Medical Advance Directive? Yes No  Does patient want to make changes to medical advance directive? Yes (MAU/Ambulatory/Procedural Areas - Information given)   Would patient like information on creating a medical advance directive?  No - Patient declined    Current Medications (verified) Outpatient Encounter Medications as of 03/06/2023  Medication Sig   ALPRAZolam (XANAX) 0.5 MG tablet TAKE ONE TABLET EVERY 6 HOURS AS NEEDED   felodipine (PLENDIL) 10 MG 24 hr tablet Take 1 tablet (10 mg total) by mouth daily.   naproxen (NAPROSYN) 500 MG tablet Take 1 tablet (500 mg total) by mouth 2 (two) times daily with a meal.   No facility-administered encounter medications on file as of 03/06/2023.    Allergies (verified) Patient has no known allergies.   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   Hypertension    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   [facility]    Osteoporosis    Past  Surgical History:  Procedure Laterality Date   CHOLECYSTECTOMY  11/22/1995   TUBAL LIGATION  11/21/1985   Family History  Problem Relation Age of Onset   Hypertension Mother    Hyperlipidemia Mother    Heart disease Mother    Social History   Socioeconomic History   Marital status: Single    Spouse name: Not on file   Number of children: Not on file   Years of education: Not on file   Highest education level: Not on file  Occupational History   Not on file  Tobacco Use   Smoking status: Never   Smokeless tobacco: Never  Substance and Sexual Activity   Alcohol use: No   Drug use: No   Sexual activity: Not Currently  Other Topics Concern   Not on file  Social History Narrative   Not on file   Social Determinants of Health   Financial Resource Strain: Medium Risk (03/06/2023)   Overall Financial Resource Strain (CARDIA)    Difficulty of Paying Living Expenses: Somewhat hard  Food Insecurity: Food Insecurity Present (03/06/2023)   Hunger Vital Sign    Worried About Running Out of Food in the Last Year: Sometimes true    Ran Out of Food in the Last Year: Sometimes true  Transportation Needs: No Transportation Needs (03/06/2023)   PRAPARE - Administrator, Civil Service (Medical): No  Lack of Transportation (Non-Medical): No  Physical Activity: Insufficiently Active (03/06/2023)   Exercise Vital Sign    Days of Exercise per Week: 3 days    Minutes of Exercise per Session: 30 min  Stress: Stress Concern Present (03/06/2023)   [employer] of Occupational Health - Occupational Stress Questionnaire    Feeling of Stress : To some extent  Social Connections: Unknown (03/06/2023)   Social Connection and Isolation Panel [NHANES]    Frequency of Communication with Friends and Family: More than three times a week    Frequency of Social Gatherings with Friends and Family: More than three times a week    Attends Religious Services: Not on Energy manager or Organizations: Yes    Attends Engineer, structural: More than 4 times per year    Marital Status: Divorced    Tobacco Counseling Counseling given: Not Answered   Clinical Intake:  Pre-visit preparation completed: Yes  Pain : No/denies pain     Diabetes: No  How often do you need to have someone help you when you read instructions, pamphlets, or other written materials from your doctor or pharmacy?: 1 - Never What is the last grade level you completed in school?: 12th grade    Activities of Daily Living    03/06/2023    3:29 PM 03/06/2023    7:12 AM  In your present state of health, do you have any difficulty performing the following activities:  Hearing? 0 0  Vision? 0 0  Difficulty concentrating or making decisions? 0 0  Walking or climbing stairs? 0 0  Dressing or bathing? 0 0  Doing errands, shopping? 0 0  Preparing Food and eating ?  N  Using the Toilet?  N  In the past six months, have you accidently leaked urine?  N  Do you have problems with loss of bowel control?  N  Managing your Medications?  N  Managing your Finances?  N  Housekeeping or managing your Housekeeping?  N    Patient Care Team: Anabel Halon, MD as PCP - General (Internal Medicine)  Indicate any recent Medical Services you may have received from other than [facility] providers in the past year (date may be approximate).     Assessment:   This is a routine wellness examination for Judith Walker.  Hearing/Vision screen No results found.  Dietary issues and exercise activities discussed:     Goals Addressed   None    Depression Screen    03/06/2023    3:29 PM 01/17/2023    1:09 PM 05/17/2022    8:32 AM 03/03/2022   11:44 AM 03/03/2022   11:41 AM 03/03/2022   11:30 AM 11/16/2021    2:54 PM  PHQ 2/9 Scores  PHQ - 2 Score 0 0 0 0 0 0 0    Fall Risk    03/06/2023    3:29 PM 03/06/2023    7:12 AM 01/17/2023    1:08 PM 05/17/2022    8:32 AM 03/03/2022   11:44 AM  Fall Risk    Falls in the past year? 0 0 0 0 0  Number falls in past yr:   0 0 0  Injury with Fall?   0 0 0  Risk for fall due to :    No Fall Risks No Fall Risks  Follow up    Falls evaluation completed Falls evaluation completed    FALL RISK PREVENTION PERTAINING TO THE HOME:  Any stairs in or around the home? Yes  If so, are there any without handrails? Yes  Home free of loose throw rugs in walkways, pet beds, electrical cords, etc? Yes  Adequate lighting in your home to reduce risk of falls? Yes   ASSISTIVE DEVICES UTILIZED TO PREVENT FALLS:  Life alert? No  Use of a cane, walker or w/c? Yes  Grab bars in the bathroom? No  Shower chair or bench in shower? No  Elevated toilet seat or a handicapped toilet? No     Cognitive Function:    03/03/2022   11:45 AM  MMSE - Mini Mental State Exam  Not completed: Unable to complete        03/06/2023    3:29 PM 03/03/2022   11:45 AM  6CIT Screen  What Year? 0 points 0 points  What month? 0 points 0 points  What time? 0 points 0 points  Count back from 20 0 points 0 points  Months in reverse 0 points 0 points  Repeat phrase 0 points 0 points  Total Score 0 points 0 points    Immunizations Immunization History  Administered Date(s) Administered   Fluad Quad(high Dose 65+) 08/24/2022   Hepatitis B 08/31/1992, 09/28/1992, 03/03/1993   Influenza,inj,Quad PF,6+ Mos 10/15/2013, 12/02/2014, 09/15/2016, 10/11/2017, 08/01/2018, 09/03/2019, 09/02/2020, 09/13/2021   Influenza-Unspecified 09/21/2005, 10/16/2007, 10/26/2009, 09/15/2010, 10/17/2011   Moderna SARS-COV2 Booster Vaccination 11/19/2020   Moderna Sars-Covid-2 Vaccination 01/21/2020, 02/18/2020   PPD Test 12/02/2014   Td 07/12/2011   Tdap 07/12/2011    TDAP status: Due, Education has been provided regarding the importance of this vaccine. Advised may receive this vaccine at local pharmacy or Health Dept. Aware to provide a copy of the vaccination record if obtained from local  pharmacy or Health Dept. Verbalized acceptance and understanding.  Flu Vaccine status: Up to date  Pneumococcal vaccine status: Due, Education has been provided regarding the importance of this vaccine. Advised may receive this vaccine at local pharmacy or Health Dept. Aware to provide a copy of the vaccination record if obtained from local pharmacy or Health Dept. Verbalized acceptance and understanding.  Covid-19 vaccine status: Information provided on how to obtain vaccines.   Qualifies for Shingles Vaccine? Yes   Zostavax completed No   Shingrix Completed?: No.    Education has been provided regarding the importance of this vaccine. Patient has been advised to call insurance company to determine out of pocket expense if they have not yet received this vaccine. Advised may also receive vaccine at local pharmacy or Health Dept. Verbalized acceptance and understanding.  Screening Tests Health Maintenance  Topic Date Due   Pneumonia Vaccine 70+ Years old (1 of 2 - PCV) Never done   Zoster Vaccines- Shingrix (1 of 2) Never done   COLONOSCOPY (Pts 45-40yrs Insurance coverage will need to be confirmed)  Never done   MAMMOGRAM  Never done   COVID-19 Vaccine (3 - Moderna risk series) 12/17/2020   DTaP/Tdap/Td (3 - Td or Tdap) 07/11/2021   Medicare Annual Wellness (AWV)  03/04/2023   INFLUENZA VACCINE  06/22/2023   DEXA SCAN  Completed   Hepatitis C Screening  Completed   HPV VACCINES  Aged Out    Health Maintenance  Health Maintenance Due  Topic Date Due   Pneumonia Vaccine 63+ Years old (1 of 2 - PCV) Never done   Zoster Vaccines- Shingrix (1 of 2) Never done   COLONOSCOPY (Pts 45-22yrs Insurance coverage will need to be confirmed)  Never  done   MAMMOGRAM  Never done   COVID-19 Vaccine (3 - Moderna risk series) 12/17/2020   DTaP/Tdap/Td (3 - Td or Tdap) 07/11/2021   Medicare Annual Wellness (AWV)  03/04/2023    Colorectal cancer screening: Type of screening: Cologuard. Patient is  completing  Mammogram status: Patient is going to follow up with PCP, did not want me to order today.   Bone Density status: Completed 01/25/2023. Results reflect: Bone density results: OSTEOPENIA. Repeat every 2 years.  Lung Cancer Screening: (Low Dose CT Chest recommended if Age 51-80 years, 30 pack-year currently smoking OR have quit w/in 15years.) does not qualify.   Additional Screening:  Hepatitis C Screening: does not qualify; Completed 09/03/2019  Vision Screening: Recommended annual ophthalmology exams for early detection of glaucoma and other disorders of the eye. Is the patient up to date with their annual eye exam?  Yes  Who is the provider or what is the name of the office in which the patient attends annual eye exams? A physician in [place] If pt is not established with a provider, would they like to be referred to a provider to establish care? No .   Dental Screening: Recommended annual dental exams for proper oral hygiene  Community Resource Referral / Chronic Care Management: CRR required this visit?  No   CCM required this visit?  No      Plan:     I have personally reviewed and noted the following in the patient's chart:   Medical and social history Use of alcohol, tobacco or illicit drugs  Current medications and supplements including opioid prescriptions. Patient is not currently taking opioid prescriptions. Functional ability and status Nutritional status Physical activity Advanced directives List of other physicians Hospitalizations, surgeries, and ER visits in previous 12 months Vitals Screenings to include cognitive, depression, and falls Referrals and appointments  In addition, I have reviewed and discussed with patient certain preventive protocols, quality metrics, and best practice recommendations. A written personalized care plan for preventive services as well as general preventive health recommendations were provided to patient.      Judith Banister, MD   03/06/2023

## 2023-03-06 NOTE — Patient Instructions (Signed)
  Judith Walker , Thank you for taking time to come for your Medicare Wellness Visit. I appreciate your ongoing commitment to your health goals. Please review the following plan we discussed and let me know if I can assist you in the future.   These are the goals we discussed: Patient is attending silver sneakers at [facility] and has goal to lose weight.   This is a list of the screening recommended for you and due dates:  Health Maintenance  Topic Date Due   Pneumonia Vaccine (1 of 2 - PCV) Never done   Zoster (Shingles) Vaccine (1 of 2) Never done   Colon Cancer Screening  Never done   Mammogram  Never done   COVID-19 Vaccine (3 - Moderna risk series) 12/17/2020   DTaP/Tdap/Td vaccine (3 - Td or Tdap) 07/11/2021   Medicare Annual Wellness Visit  03/04/2023   Flu Shot  06/22/2023   DEXA scan (bone density measurement)  Completed   Hepatitis C Screening: USPSTF Recommendation to screen - Ages 69-79 yo.  Completed   HPV Vaccine  Aged Out

## 2023-03-16 ENCOUNTER — Encounter: Payer: Self-pay | Admitting: Emergency Medicine

## 2023-03-16 ENCOUNTER — Other Ambulatory Visit: Payer: Self-pay

## 2023-03-16 ENCOUNTER — Ambulatory Visit
Admission: EM | Admit: 2023-03-16 | Discharge: 2023-03-16 | Disposition: A | Discharge status: Home / Self Care | Payer: Medicare Other | Attending: Family Medicine | Admitting: Family Medicine

## 2023-03-16 DIAGNOSIS — M62830 Muscle spasm of back: Secondary | ICD-10-CM

## 2023-03-16 DIAGNOSIS — M545 Low back pain, unspecified: Secondary | ICD-10-CM | POA: Diagnosis not present

## 2023-03-16 MED ORDER — TIZANIDINE HCL 4 MG PO TABS: 4.0000 mg | ORAL_TABLET | Freq: Four times a day (QID) | ORAL | 0 refills | Status: DC | PRN

## 2023-03-16 MED ORDER — MELOXICAM 15 MG PO TABS: 15.0000 mg | ORAL_TABLET | Freq: Every day | ORAL | 0 refills | Status: DC

## 2023-03-16 NOTE — ED Provider Notes (Signed)
[facility]ENTER   [id number] 03/16/23 Arrival Time: 1338  ASSESSMENT & PLAN:  1. Acute left-sided low back pain without sciatica   2. Muscle spasm of back    Suspect muscle strain/spasm of lower back. Able to ambulate here and hemodynamically stable. No indication for imaging of back at this time given no trauma and normal neurological exam. Discussed.  Meds ordered this encounter  Medications   tiZANidine (ZANAFLEX) 4 MG tablet    Sig: Take 1 tablet (4 mg total) by mouth every 6 (six) hours as needed for muscle spasms.    Dispense:  30 tablet    Refill:  0   meloxicam (MOBIC) 15 MG tablet    Sig: Take 1 tablet (15 mg total) by mouth daily.    Dispense:  7 tablet    Refill:  0   Medication sedation precautions given. Encourage ROM/movement as tolerated.  Recommend:  Follow-up Information     Anabel Halon, MD.   Specialty: Internal Medicine Why: If worsening or failing to improve as anticipated. Contact information: [address] [place] [address] [phone]         [facility] [place].   Specialty: Urgent Care Why: If worsening or failing to improve as anticipated. Contact information: [address], [address] [place] [address] [phone]                Reviewed expectations re: course of current medical issues. Questions answered. Outlined signs and symptoms indicating need for more acute intervention. Patient verbalized understanding. After Visit Summary given.   SUBJECTIVE: History from: patient.  Judith Walker is a 67 y.o. female who presents with complaint of fairly persistent left sided lower back discomfort. Onset abrupt. First noted  2 d ago after lifting a heavy box. Denies back trauma. . History of back problems requiring medical care: rare. Pain described as aching and without radiation. Aggravating factors: certain movements and prolonged sitting. Alleviating factors: have  not been identified. Progressive LE weakness or saddle anesthesia: none. Extremity sensation changes or weakness: none. Ambulatory without difficulty. Normal bowel/bladder habits: yes; without urinary retention. Normal PO intake without n/v. No associated abdominal pain/n/v. No tx PTA.  OBJECTIVE:  Vitals:   03/16/23 1355  BP: (!) 146/88  Pulse: 89  Resp: 20  Temp: 98.8 F (37.1 C)  TempSrc: Oral  SpO2: 94%    General appearance: alert; no distress HEENT: [place]; AT Neck: supple with FROM; without midline tenderness CV: regular Lungs: unlabored respirations; speaks full sentences without difficulty Abdomen: soft, non-tender; non-distended Back: moderate and poorly localized tenderness to palpation over left lumbar musculature ; FROM at waist; bruising: none; without midline tenderness Extremities: without edema; symmetrical without gross deformities; normal ROM of bilateral LE Skin: warm and dry Neurologic: normal gait; normal sensation and strength of bilateral LE Psychological: alert and cooperative; normal mood and affect  No Known Allergies  Past Medical History:  Diagnosis Date   Anxiety    Arthritis    Cancer    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   Hypertension    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   [facility]    Osteoporosis    Social History   Socioeconomic History   Marital status: Single    Spouse name: Not on file   Number of children: Not on file   Years of education: Not on file   Highest education level: Not on file  Occupational History   Not on file  Tobacco Use  Smoking status: Never   Smokeless tobacco: Never  Substance and Sexual Activity   Alcohol use: No   Drug use: No   Sexual activity: Not Currently  Other Topics Concern   Not on file  Social History Narrative   Not on file   Social Determinants of Health   Financial Resource Strain: Medium Risk (03/06/2023)   Overall Financial Resource Strain (CARDIA)    Difficulty of Paying Living Expenses:  Somewhat hard  Food Insecurity: Food Insecurity Present (03/06/2023)   Hunger Vital Sign    Worried About Running Out of Food in the Last Year: Sometimes true    Ran Out of Food in the Last Year: Sometimes true  Transportation Needs: No Transportation Needs (03/06/2023)   PRAPARE - Administrator, Civil Service (Medical): No    Lack of Transportation (Non-Medical): No  Physical Activity: Insufficiently Active (03/06/2023)   Exercise Vital Sign    Days of Exercise per Week: 3 days    Minutes of Exercise per Session: 30 min  Stress: Stress Concern Present (03/06/2023)   [employer] of Occupational Health - Occupational Stress Questionnaire    Feeling of Stress : To some extent  Social Connections: Unknown (03/06/2023)   Social Connection and Isolation Panel [NHANES]    Frequency of Communication with Friends and Family: More than three times a week    Frequency of Social Gatherings with Friends and Family: More than three times a week    Attends Religious Services: Not on file    Active Member of Clubs or Organizations: Yes    Attends Banker Meetings: More than 4 times per year    Marital Status: Divorced  Intimate Partner Violence: Not At Risk (03/03/2022)   Humiliation, Afraid, Rape, and Kick questionnaire    Fear of Current or Ex-Partner: No    Emotionally Abused: No    Physically Abused: No    Sexually Abused: No   Family History  Problem Relation Age of Onset   Hypertension Mother    Hyperlipidemia Mother    Heart disease Mother    Past Surgical History:  Procedure Laterality Date   CHOLECYSTECTOMY  11/22/1995   TUBAL LIGATION  11/21/1985      Mardella Layman, MD 03/16/23 1435

## 2023-03-16 NOTE — ED Triage Notes (Signed)
Pt reports was lifting boxes full of canned goods and reports left sided back/flank pain ever since. Pt reports increase pain with twisting motion on the right.

## 2023-07-19 ENCOUNTER — Encounter: Payer: Self-pay | Admitting: Internal Medicine

## 2023-07-19 ENCOUNTER — Telehealth: Payer: Self-pay | Admitting: Internal Medicine

## 2023-07-19 ENCOUNTER — Ambulatory Visit (INDEPENDENT_AMBULATORY_CARE_PROVIDER_SITE_OTHER): Payer: Medicare Other | Admitting: Internal Medicine

## 2023-07-19 VITALS — BP 117/71 | HR 76 | Ht 65.0 in | Wt 205.8 lb

## 2023-07-19 DIAGNOSIS — Z23 Encounter for immunization: Secondary | ICD-10-CM | POA: Diagnosis not present

## 2023-07-19 DIAGNOSIS — Z1231 Encounter for screening mammogram for malignant neoplasm of breast: Secondary | ICD-10-CM | POA: Diagnosis not present

## 2023-07-19 DIAGNOSIS — E6609 Other obesity due to excess calories: Secondary | ICD-10-CM

## 2023-07-19 DIAGNOSIS — Z6834 Body mass index (BMI) 34.0-34.9, adult: Secondary | ICD-10-CM

## 2023-07-19 DIAGNOSIS — R7303 Prediabetes: Secondary | ICD-10-CM

## 2023-07-19 DIAGNOSIS — E782 Mixed hyperlipidemia: Secondary | ICD-10-CM

## 2023-07-19 DIAGNOSIS — M1710 Unilateral primary osteoarthritis, unspecified knee: Secondary | ICD-10-CM | POA: Diagnosis not present

## 2023-07-19 DIAGNOSIS — I10 Essential (primary) hypertension: Secondary | ICD-10-CM | POA: Diagnosis not present

## 2023-07-19 DIAGNOSIS — E559 Vitamin D deficiency, unspecified: Secondary | ICD-10-CM

## 2023-07-19 DIAGNOSIS — E66811 Obesity, class 1: Secondary | ICD-10-CM

## 2023-07-19 DIAGNOSIS — F411 Generalized anxiety disorder: Secondary | ICD-10-CM

## 2023-07-19 HISTORY — DX: Mixed hyperlipidemia: E78.2

## 2023-07-19 MED ORDER — ALPRAZOLAM 0.5 MG PO TABS: 0.5000 mg | ORAL_TABLET | Freq: Three times a day (TID) | ORAL | 0 refills | Status: AC | PRN

## 2023-07-19 MED ORDER — MELOXICAM 15 MG PO TABS: 15.0000 mg | ORAL_TABLET | Freq: Every day | ORAL | 3 refills | Status: DC

## 2023-07-19 NOTE — Telephone Encounter (Signed)
lvm

## 2023-07-19 NOTE — Assessment & Plan Note (Signed)
BP Readings from Last 1 Encounters:  07/19/23 117/71   Usually well-controlled with Felodipine Counseled for compliance with the medications Advised DASH diet and moderate exercise/walking, at least 150 mins/week

## 2023-07-19 NOTE — Telephone Encounter (Signed)
Patient was just seen today for visit. Asking does she need to have bloodwork done before her next visit. Patient is asking for a call back to let her know either way. [phone]

## 2023-07-19 NOTE — Patient Instructions (Signed)
Please take Meloxicam as needed for knee pain.  Please continue to take other medications as prescribed.  Please continue to follow low salt diet and perform moderate exercise/walking at least 150 mins/week.

## 2023-07-19 NOTE — Progress Notes (Signed)
Established Patient Office Visit  Subjective:  Patient ID: Judith Walker, female    DOB: Feb 16, 1956  Age: 67 y.o. MRN: [id number]  CC:  Chief Complaint  Patient presents with   Hypertension    Follow up    HPI Judith Walker is a 67 y.o. 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  HTN: BP is well-controlled. Takes medications regularly. Patient denies headache, dizziness, chest pain, dyspnea or palpitations.   She has been having b/l knee pain, for which she takes Tylenol as needed, but still has swelling and severe pain at times.  She denies any recent injury or fall.  She has seen Dr. Romeo Apple in the past.  She wants to wait for now for PCV20.   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   [facility]    Mixed hyperlipidemia 07/19/2023   Osteoporosis     Past Surgical History:  Procedure Laterality Date   CHOLECYSTECTOMY  11/22/1995   TUBAL LIGATION  11/21/1985    Family History  Problem Relation Age of Onset   Hypertension Mother    Hyperlipidemia Mother    Heart disease Mother     Social History   Socioeconomic History   Marital status: Single    Spouse name: Not on file   Number of children: Not on file   Years of education: Not on file   Highest education level: Not on file  Occupational History   Not on file  Tobacco Use   Smoking status: Never   Smokeless tobacco: Never  Substance and Sexual Activity   Alcohol use: No   Drug use: No   Sexual activity: Not Currently  Other Topics Concern   Not on file  Social History Narrative   Not on file   Social Determinants of Health   Financial Resource Strain: Medium Risk (03/06/2023)   Overall Financial Resource Strain (CARDIA)    Difficulty  of Paying Living Expenses: Somewhat hard  Food Insecurity: Food Insecurity Present (03/06/2023)   Hunger Vital Sign    Worried About Running Out of Food in the Last Year: Sometimes true    Ran Out of Food in the Last Year: Sometimes true  Transportation Needs: No Transportation Needs (03/06/2023)   PRAPARE - Administrator, Civil Service (Medical): No    Lack of Transportation (Non-Medical): No  Physical Activity: Insufficiently Active (03/06/2023)   Exercise Vital Sign    Days of Exercise per Week: 3 days    Minutes of Exercise per Session: 30 min  Stress: Stress Concern Present (03/06/2023)   [employer] of Occupational Health - Occupational Stress Questionnaire    Feeling of Stress : To some extent  Social Connections: Unknown (03/06/2023)   Social Connection and Isolation Panel [NHANES]    Frequency of Communication with Friends and Family: More than three times a week    Frequency of Social Gatherings with Friends and Family: More than three times a week    Attends Religious Services: Not on file    Active Member of Clubs or Organizations: Yes    Attends Banker Meetings: More than 4 times per year    Marital Status: Divorced  Intimate Partner Violence: Not  At Risk (03/03/2022)   Humiliation, Afraid, Rape, and Kick questionnaire    Fear of Current or Ex-Partner: No    Emotionally Abused: No    Physically Abused: No    Sexually Abused: No    Outpatient Medications Prior to Visit  Medication Sig Dispense Refill   felodipine (PLENDIL) 10 MG 24 hr tablet Take 1 tablet (10 mg total) by mouth daily. 90 tablet 3   ALPRAZolam (XANAX) 0.5 MG tablet TAKE ONE TABLET EVERY 6 HOURS AS NEEDED 60 tablet 0   meloxicam (MOBIC) 15 MG tablet Take 1 tablet (15 mg total) by mouth daily. 7 tablet 0   tiZANidine (ZANAFLEX) 4 MG tablet Take 1 tablet (4 mg total) by mouth every 6 (six) hours as needed for muscle spasms. 30 tablet 0   No facility-administered medications  prior to visit.    No Known Allergies  ROS Review of Systems  Constitutional:  Negative for chills and fever.  HENT:  Negative for congestion, sinus pressure, sinus pain and sore throat.   Eyes:  Negative for pain and discharge.  Respiratory:  Negative for cough and shortness of breath.   Cardiovascular:  Negative for chest pain and palpitations.  Gastrointestinal:  Negative for abdominal pain, constipation, diarrhea, nausea and vomiting.  Endocrine: Negative for polydipsia and polyuria.  Genitourinary:  Negative for dysuria and hematuria.  Musculoskeletal:  Positive for arthralgias. Negative for neck pain and neck stiffness.  Skin:  Negative for rash.  Neurological:  Negative for dizziness and weakness.  Psychiatric/Behavioral:  Negative for agitation and behavioral problems.       Objective:    Physical Exam Vitals reviewed.  Constitutional:      General: She is not in acute distress.    Appearance: She is obese. She is not diaphoretic.  HENT:     Head: Normocephalic and atraumatic.     Nose: Nose normal.     Mouth/Throat:     Mouth: Mucous membranes are moist.  Eyes:     General: No scleral icterus.    Extraocular Movements: Extraocular movements intact.  Cardiovascular:     Rate and Rhythm: Normal rate and regular rhythm.     Pulses: Normal pulses.     Heart sounds: Normal heart sounds. No murmur heard. Pulmonary:     Breath sounds: Normal breath sounds. No wheezing or rales.  Musculoskeletal:     Cervical back: Neck supple. No tenderness.     Right knee: Swelling present. Decreased range of motion.     Left knee: Swelling present. Decreased range of motion.     Right lower leg: No edema.     Left lower leg: No edema.  Skin:    General: Skin is warm.     Findings: No rash.  Neurological:     General: No focal deficit present.     Mental Status: She is alert and oriented to person, place, and time.     Sensory: No sensory deficit.     Motor: No weakness.   Psychiatric:        Mood and Affect: Mood normal.        Behavior: Behavior normal.     BP 117/71   Pulse 76   Ht 5\' 5"  (1.651 m)   Wt 205 lb 12.8 oz (93.4 kg)   SpO2 92%   BMI 34.25 kg/m  Wt Readings from Last 3 Encounters:  07/19/23 205 lb 12.8 oz (93.4 kg)  01/17/23 205 lb (93 kg)  05/17/22 208  lb 6.4 oz (94.5 kg)    Lab Results  Component Value Date   TSH 0.541 01/17/2023   Lab Results  Component Value Date   WBC 5.5 01/17/2023   HGB 13.9 01/17/2023   HCT 43.3 01/17/2023   MCV 92 01/17/2023   PLT 310 01/17/2023   Lab Results  Component Value Date   NA 137 01/17/2023   K 4.3 01/17/2023   CO2 22 01/17/2023   GLUCOSE 78 01/17/2023   BUN 11 01/17/2023   CREATININE 0.97 01/17/2023   BILITOT 0.3 01/17/2023   ALKPHOS 104 01/17/2023   AST 15 01/17/2023   ALT 10 01/17/2023   PROT 8.1 01/17/2023   ALBUMIN 4.5 01/17/2023   CALCIUM 9.7 01/17/2023   EGFR 64 01/17/2023   Lab Results  Component Value Date   CHOL 186 01/17/2023   Lab Results  Component Value Date   HDL 42 01/17/2023   Lab Results  Component Value Date   LDLCALC 120 (H) 01/17/2023   Lab Results  Component Value Date   TRIG 132 01/17/2023   Lab Results  Component Value Date   CHOLHDL 4.4 01/17/2023   Lab Results  Component Value Date   HGBA1C 5.8 (H) 01/17/2023      Assessment & Plan:   Problem List Items Addressed This Visit       Cardiovascular and Mediastinum   Essential hypertension, benign - Primary    BP Readings from Last 1 Encounters:  07/19/23 117/71   Usually well-controlled with Felodipine Counseled for compliance with the medications Advised DASH diet and moderate exercise/walking, at least 150 mins/week        Musculoskeletal and Integument   Arthritis of knee    Takes Tylenol as needed Added Mobic PRN Referred to Orthopedic surgery - has h/o moderate to severe OA of knee noted in previous x-ray of knee b/l      Relevant Medications   meloxicam (MOBIC)  15 MG tablet   Other Relevant Orders   Ambulatory referral to Orthopedic Surgery     Other   Obesity    Diet modification and moderate exercise advised      Vitamin D deficiency    Advised to take Vitamin D 5000 IU QD      GAD (generalized anxiety disorder)    Xanax 0.5 mg as needed, takes it occasionally      Relevant Medications   ALPRAZolam (XANAX) 0.5 MG tablet   Prediabetes    Lab Results  Component Value Date   HGBA1C 5.8 (H) 01/17/2023   Advised to follow DASH diet for now      Mixed hyperlipidemia    Lipid profile reviewed Advised to follow DASH diet      Other Visit Diagnoses     Encounter for screening mammogram for breast cancer       Relevant Orders   MM 3D SCREENING MAMMOGRAM BILATERAL BREAST   Encounter for immunization       Relevant Orders   Flu Vaccine Trivalent High Dose (Fluad) (Completed)        Meds ordered this encounter  Medications   meloxicam (MOBIC) 15 MG tablet    Sig: Take 1 tablet (15 mg total) by mouth daily.    Dispense:  30 tablet    Refill:  3   ALPRAZolam (XANAX) 0.5 MG tablet    Sig: Take 1 tablet (0.5 mg total) by mouth 3 (three) times daily as needed for anxiety.    Dispense:  20 tablet  Refill:  0    Follow-up: Return in about 6 months (around 01/19/2024) for Annual physical.    Anabel Halon, MD

## 2023-07-19 NOTE — Assessment & Plan Note (Addendum)
Takes Tylenol as needed Added Mobic PRN Referred to Orthopedic surgery - has h/o moderate to severe OA of knee noted in previous x-ray of knee b/l

## 2023-07-21 NOTE — Assessment & Plan Note (Signed)
Lab Results  Component Value Date   HGBA1C 5.8 (H) 01/17/2023   Advised to follow DASH diet for now

## 2023-07-21 NOTE — Assessment & Plan Note (Addendum)
Lipid profile reviewed Advised to follow DASH diet 

## 2023-07-21 NOTE — Assessment & Plan Note (Signed)
Xanax 0.5 mg as needed, takes it occasionally

## 2023-07-21 NOTE — Assessment & Plan Note (Signed)
Diet modification and moderate exercise advised. 

## 2023-07-21 NOTE — Assessment & Plan Note (Signed)
Advised to take Vitamin D 5000 IU QD 

## 2023-08-04 ENCOUNTER — Ambulatory Visit: Payer: Medicare Other | Admitting: Orthopedic Surgery

## 2023-08-25 ENCOUNTER — Encounter: Payer: Self-pay | Admitting: Orthopedic Surgery

## 2023-08-25 ENCOUNTER — Other Ambulatory Visit (INDEPENDENT_AMBULATORY_CARE_PROVIDER_SITE_OTHER): Payer: Medicare Other

## 2023-08-25 ENCOUNTER — Ambulatory Visit (INDEPENDENT_AMBULATORY_CARE_PROVIDER_SITE_OTHER): Payer: Medicare Other | Admitting: Orthopedic Surgery

## 2023-08-25 ENCOUNTER — Other Ambulatory Visit: Payer: Self-pay

## 2023-08-25 VITALS — BP 122/78 | HR 78 | Ht 65.0 in | Wt 203.0 lb

## 2023-08-25 DIAGNOSIS — M170 Bilateral primary osteoarthritis of knee: Secondary | ICD-10-CM

## 2023-08-25 DIAGNOSIS — M1712 Unilateral primary osteoarthritis, left knee: Secondary | ICD-10-CM

## 2023-08-25 DIAGNOSIS — M1711 Unilateral primary osteoarthritis, right knee: Secondary | ICD-10-CM

## 2023-08-25 DIAGNOSIS — G8929 Other chronic pain: Secondary | ICD-10-CM

## 2023-08-25 NOTE — Progress Notes (Signed)
Chief Complaint  Patient presents with   Knee Pain    Patient has been having bil knee pain x 1 year with the left worse than the right .  She had a fall last week due to the rug in the kitchen but fell on her right side not her knees    Patient: Judith Walker           Date of Birth: 1956/09/19           MRN: [id number] Visit Date: 08/25/2023 Requested by: Anabel Halon, MD [address] [place],  [place] [address] PCP: Anabel Halon, MD   Chief Complaint  Patient presents with   Knee Pain    Patient has been habving bil knee pain x 1 year with the left worse than the right .  She had a fall last week due to the rug in the kitchen but fell on her right side not her knees    Encounter Diagnoses  Name Primary?   Bilateral chronic knee pain    Primary osteoarthritis of right knee Yes   Primary osteoarthritis of left knee     Plan:   NON OP CARE   TYLENOL ARTHRITIS FOR PAIN   Chief Complaint  Patient presents with   Knee Pain    Patient has been habving bil knee pain x 1 year with the left worse than the right .  She had a fall last week due to the rug in the kitchen but fell on her right side not her knees     67 year old female presents to [place] for evaluation of bilateral knee pain for proxy 1 year with intermittent symptoms.  Her main problem is pain when getting up from a seated position and pain or throbbing at night with occasional feelings that the knee is tight  She says the pain and symptoms are tolerable by Dr. Allena Katz thought she should see an orthopedist regarding the symptoms  She has not taken any anti-inflammatory medication up to this time  She says when she gets out of a chair she does limp a little bit.    Body mass index is 33.78 kg/m.   Problem list, medical hx, medications and allergies reviewed   Review of Systems  Constitutional:  Negative for fever.  Respiratory:  Negative for shortness of breath.   Cardiovascular:  Negative for chest pain.   Skin: Negative.   Neurological:  Negative for tingling and sensory change.     No Known Allergies  BP 122/78   Pulse 78   Ht 5\' 5"  (1.651 m)   Wt 203 lb (92.1 kg)   BMI 33.78 kg/m   Physical Exam Vitals and nursing note reviewed.  Constitutional:      Appearance: Normal appearance.  HENT:     Head: Normocephalic and atraumatic.  Eyes:     General: No scleral icterus.       Right eye: No discharge.        Left eye: No discharge.     Extraocular Movements: Extraocular movements intact.     Conjunctiva/sclera: Conjunctivae normal.     Pupils: Pupils are equal, round, and reactive to light.  Cardiovascular:     Rate and Rhythm: Normal rate.     Pulses: Normal pulses.  Skin:    General: Skin is warm and dry.     Capillary Refill: Capillary refill takes less than 2 seconds.  Neurological:     General: No focal deficit present.  Mental Status: She is alert and oriented to person, place, and time.  Psychiatric:        Mood and Affect: Mood normal.        Behavior: Behavior normal.        Thought Content: Thought content normal.        Judgment: Judgment normal.     Ortho Exam  MSK:  -Right and left knee.  No effusion.  Tenderness minimal.  Range of motion is limited to a total arc of 110 degrees.  It feels to me like the left knee has a slight flexion contracture of the right knee does not.  Both knees remained stable.  Data reviewed:   Image(s) reviewed with personal interpretation:   GRADE 4 BILATERAL OA   Assessment and plan:  Encounter Diagnoses  Name Primary?   Bilateral chronic knee pain    Primary osteoarthritis of right knee Yes   Primary osteoarthritis of left knee     Weight loss patient education Tylenol for pain follow-up when symptomatic   No orders of the defined types were placed in this encounter.   Procedures:   NO

## 2023-08-25 NOTE — Patient Instructions (Signed)
Take Tylenol extra strength 500 mg for the knees

## 2023-09-26 ENCOUNTER — Other Ambulatory Visit: Payer: Self-pay | Admitting: Internal Medicine

## 2023-09-26 DIAGNOSIS — I10 Essential (primary) hypertension: Secondary | ICD-10-CM

## 2023-09-27 ENCOUNTER — Telehealth: Payer: Self-pay

## 2023-09-27 NOTE — Patient Outreach (Signed)
[facility] Assistant attempted to call patient on today regarding preventative mammogram screening. No answer from patient after multiple rings. Assistant left confidential voicemail for patient to return call.  Will call back patient back for final attempt.  Baruch Gouty [facility] Assistant [facility] [phone]

## 2024-01-20 ENCOUNTER — Encounter: Payer: Self-pay | Admitting: Emergency Medicine

## 2024-01-20 ENCOUNTER — Ambulatory Visit: Admission: EM | Admit: 2024-01-20 | Discharge: 2024-01-20 | Disposition: A | Discharge status: Home / Self Care

## 2024-01-20 DIAGNOSIS — J0140 Acute pansinusitis, unspecified: Secondary | ICD-10-CM

## 2024-01-20 LAB — POC COVID19/FLU A&B COMBO
Covid Antigen, POC: NEGATIVE
Influenza A Antigen, POC: NEGATIVE
Influenza B Antigen, POC: NEGATIVE

## 2024-01-20 MED ORDER — FLUTICASONE PROPIONATE 50 MCG/ACT NA SUSP: 2.0000 | Freq: Every day | NASAL | 0 refills | Status: AC

## 2024-01-20 MED ORDER — AMOXICILLIN-POT CLAVULANATE 875-125 MG PO TABS: 1.0000 | ORAL_TABLET | Freq: Two times a day (BID) | ORAL | 0 refills | Status: DC

## 2024-01-20 NOTE — Discharge Instructions (Addendum)
 The COVID/flu test was negative. Take medication as prescribed.  Recommend over-the-counter Delsym for your cough. Increase fluids and allow for plenty of rest. May use normal saline nasal spray throughout the day for nasal congestion and runny nose. For your cough, recommend use of a humidifier in your bedroom at nighttime during sleep and sleeping elevated on pillows while cough symptoms persist. If symptoms fail to improve with this treatment, please follow-up with your primary care physician for further evaluation. Follow-up as needed.

## 2024-01-20 NOTE — ED Triage Notes (Signed)
 Headache, body aches, cough, nasal drainage since Thursday.  Has been taking Coricidin HBP.

## 2024-01-20 NOTE — ED Provider Notes (Signed)
 [facility]    CSN: [id number] Arrival date & time: 01/20/24  0818      History   Chief Complaint No chief complaint on file.   HPI Judith Walker is a 68 y.o. female.   The history is provided by the patient.   Patient presents with a 2-day history of headache, body aches, nasal congestion, runny nose, clogged ears, and cough.  Patient states "I have the worst headache" and "even my glasses hurt my face".  She also complains of tooth pain.  Denies fever, chills, sore throat, ear drainage, wheezing, difficulty breathing, chest pain, abdominal pain, nausea, vomiting, diarrhea, or rash.  Patient states she has been taking Coricidin for symptoms. 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   [facility]    Mixed hyperlipidemia 07/19/2023   Osteoporosis     Patient Active Problem List   Diagnosis Date Noted   Mixed hyperlipidemia 07/19/2023   Prediabetes 01/17/2023   Screening for colon cancer 01/17/2023   Encounter for general adult medical examination with abnormal findings 11/16/2021   Leukemia (HCC) 02/04/2021   Arthritis of knee 02/04/2021   GAD (generalized anxiety disorder) 10/11/2017   Vitamin D deficiency 06/01/2016   Essential hypertension, benign 08/03/2012   Insomnia 08/03/2012   Obesity 08/03/2012    Past Surgical History:  Procedure Laterality Date   CHOLECYSTECTOMY  11/22/1995   TUBAL LIGATION  11/21/1985    OB History   No obstetric history on file.      Home Medications    Prior to Admission medications   Medication Sig Start Date End Date Taking? Authorizing Provider  amLODipine (NORVASC) 2.5 MG tablet Take 2.5 mg by mouth daily.   Yes [provider]  amoxicillin-clavulanate (AUGMENTIN) 875-125 MG tablet Take 1 tablet by mouth every 12 (twelve) hours. 01/20/24  Yes Leath-Warren, Sadie Haber, NP   fluticasone (FLONASE) 50 MCG/ACT nasal spray Place 2 sprays into both nostrils daily. 01/20/24  Yes Leath-Warren, Sadie Haber, NP  ALPRAZolam Prudy Feeler) 0.5 MG tablet Take 1 tablet (0.5 mg total) by mouth 3 (three) times daily as needed for anxiety. 07/19/23   Anabel Halon, MD    Family History Family History  Problem Relation Age of Onset   Hypertension Mother    Hyperlipidemia Mother    Heart disease Mother     Social History Social History   Tobacco Use   Smoking status: Never   Smokeless tobacco: Never  Substance Use Topics   Alcohol use: No   Drug use: No     Allergies   Patient has no known allergies.   Review of Systems Review of Systems Per HPI  Physical Exam Triage Vital Signs ED Triage Vitals  Encounter Vitals Group     BP 01/20/24 0848 119/77     Systolic BP Percentile --      Diastolic BP Percentile --      Pulse Rate 01/20/24 0848 93     Resp 01/20/24 0848 18     Temp 01/20/24 0848 100 F (37.8 C)     Temp Source 01/20/24 0848 Oral     SpO2 01/20/24 0848 93 %     Weight --      Height --      Head Circumference --      Peak Flow --  Pain Score 01/20/24 0849 8     Pain Loc --      Pain Education --      Exclude from Growth Chart --    No data found.  Updated Vital Signs BP 119/77 (BP Location: Right Arm)   Pulse 93   Temp 100 F (37.8 C) (Oral)   Resp 18   SpO2 93%   Visual Acuity Right Eye Distance:   Left Eye Distance:   Bilateral Distance:    Right Eye Near:   Left Eye Near:    Bilateral Near:     Physical Exam Vitals and nursing note reviewed.  Constitutional:      General: She is not in acute distress.    Appearance: Normal appearance.  HENT:     Head: Normocephalic.     Right Ear: Tympanic membrane, ear canal and external ear normal.     Left Ear: Tympanic membrane, ear canal and external ear normal.     Nose: Congestion present.     Right Turbinates: Enlarged and swollen.     Left Turbinates: Enlarged and  swollen.     Right Sinus: Maxillary sinus tenderness and frontal sinus tenderness present.     Left Sinus: Maxillary sinus tenderness and frontal sinus tenderness present.     Mouth/Throat:     Lips: Pink.     Mouth: Mucous membranes are moist.     Pharynx: Uvula midline. Postnasal drip present. No pharyngeal swelling, oropharyngeal exudate, posterior oropharyngeal erythema or uvula swelling.     Comments: Cobblestoning present to posterior oropharynx  Eyes:     Extraocular Movements: Extraocular movements intact.     Conjunctiva/sclera: Conjunctivae normal.     Pupils: Pupils are equal, round, and reactive to light.  Cardiovascular:     Rate and Rhythm: Normal rate and regular rhythm.     Pulses: Normal pulses.     Heart sounds: Normal heart sounds.  Pulmonary:     Effort: Pulmonary effort is normal. No respiratory distress.     Breath sounds: Normal breath sounds. No stridor. No wheezing, rhonchi or rales.  Abdominal:     General: Bowel sounds are normal.     Palpations: Abdomen is soft.     Tenderness: There is no abdominal tenderness.  Musculoskeletal:     Cervical back: Normal range of motion.  Lymphadenopathy:     Cervical: No cervical adenopathy.  Skin:    General: Skin is warm and dry.  Neurological:     General: No focal deficit present.     Mental Status: She is alert and oriented to person, place, and time.  Psychiatric:        Mood and Affect: Mood normal.        Behavior: Behavior normal.      UC Treatments / Results  Labs (all labs ordered are listed, but only abnormal results are displayed) Labs Reviewed  POC COVID19/FLU A&B COMBO    EKG   Radiology No results found.  Procedures Procedures (including critical care time)  Medications Ordered in UC Medications - No data to display  Initial Impression / Assessment and Plan / UC Course  I have reviewed the triage vital signs and the nursing notes.  Pertinent labs & imaging results that were  available during my care of the patient were reviewed by me and considered in my medical decision making (see chart for details).  On exam, lung sounds are clear throughout.  Patient with moderate frontal and maxillary sinus  tenderness and pressure on exam.  Symptoms are consistent with acute pansinusitis.  Will treat empirically with Augmentin 875/125 mg twice daily for 7 days, along with fluticasone 50 mcg nasal spray for nasal congestion.  Patient was advised to switch to Delsym cough syrup as needed for cough.  Supportive care recommendations were provided and discussed with the patient to include fluids, rest, normal saline nasal spray, and over-the-counter analgesics.  Discussed indications regarding follow-up.  Patient was in agreement with this plan of care and verbalized understanding.  All questions were answered.  Patient stable for discharge.  Work note was provided.  Final Clinical Impressions(s) / UC Diagnoses   Final diagnoses:  Acute pansinusitis, recurrence not specified     Discharge Instructions      The COVID/flu test was negative. Take medication as prescribed.  Recommend over-the-counter Delsym for your cough. Increase fluids and allow for plenty of rest. May use normal saline nasal spray throughout the day for nasal congestion and runny nose. For your cough, recommend use of a humidifier in your bedroom at nighttime during sleep and sleeping elevated on pillows while cough symptoms persist. If symptoms fail to improve with this treatment, please follow-up with your primary care physician for further evaluation. Follow-up as needed.      ED Prescriptions     Medication Sig Dispense Auth. Provider   amoxicillin-clavulanate (AUGMENTIN) 875-125 MG tablet Take 1 tablet by mouth every 12 (twelve) hours. 14 tablet Leath-Warren, Sadie Haber, NP   fluticasone (FLONASE) 50 MCG/ACT nasal spray Place 2 sprays into both nostrils daily. 16 g Leath-Warren, Sadie Haber, NP       PDMP not reviewed this encounter.   Abran Cantor, NP 01/20/24 [phone]

## 2024-01-24 ENCOUNTER — Encounter: Payer: Medicare Other | Admitting: Internal Medicine

## 2024-03-06 ENCOUNTER — Ambulatory Visit (INDEPENDENT_AMBULATORY_CARE_PROVIDER_SITE_OTHER): Payer: Medicare Other

## 2024-03-06 VITALS — BP 119/77 | Ht 65.0 in | Wt 200.0 lb

## 2024-03-06 DIAGNOSIS — Z2821 Immunization not carried out because of patient refusal: Secondary | ICD-10-CM

## 2024-03-06 DIAGNOSIS — Z0000 Encounter for general adult medical examination without abnormal findings: Secondary | ICD-10-CM

## 2024-03-06 DIAGNOSIS — Z5320 Procedure and treatment not carried out because of patient's decision for unspecified reasons: Secondary | ICD-10-CM

## 2024-03-06 NOTE — Progress Notes (Signed)
 Because this visit was a virtual/telehealth visit,  certain criteria was not obtained, such a blood pressure, CBG if applicable, and timed get up and go. Any medications not marked as "taking" were not mentioned during the medication reconciliation part of the visit. Any vitals not documented were not able to be obtained due to this being a telehealth visit or patient was unable to self-report a recent blood pressure reading due to a lack of equipment at home via telehealth. Vitals that have been documented are verbally provided by the patient.  Subjective:   Judith Walker is a 68 y.o. who presents for a Medicare Wellness preventive visit.  Visit Complete: Virtual I connected with  Judith Walker on 03/06/24 by a audio enabled telemedicine application and verified that I am speaking with the correct person using two identifiers.  Patient Location: Home  Provider Location: Home Office  I discussed the limitations of evaluation and management by telemedicine. The patient expressed understanding and agreed to proceed.  Vital Signs: Because this visit was a virtual/telehealth visit, some criteria may be missing or patient reported. Any vitals not documented were not able to be obtained and vitals that have been documented are patient reported.  VideoDeclined- This patient declined Librarian, academic. Therefore the visit was completed with audio only.  Persons Participating in Visit: Patient.  AWV Questionnaire: No: Patient Medicare AWV questionnaire was not completed prior to this visit.  Cardiac Risk Factors include: advanced age (>15men, >59 women);dyslipidemia;hypertension;obesity (BMI >30kg/m2)     Objective:    Today's Vitals   03/06/24 1321  BP: 119/77  Weight: 200 lb (90.7 kg)  Height: 5\' 5"  (1.651 m)   Body mass index is 33.28 kg/m.     03/06/2024    1:20 PM 03/06/2023    3:28 PM 03/03/2022   11:44 AM  Advanced Directives  Does Patient Have a  Medical Advance Directive? No Yes No  Does patient want to make changes to medical advance directive?  Yes (MAU/Ambulatory/Procedural Areas - Information given)   Would patient like information on creating a medical advance directive? No - Patient declined  No - Patient declined    Current Medications (verified) Outpatient Encounter Medications as of 03/06/2024  Medication Sig   ALPRAZolam (XANAX) 0.5 MG tablet Take 1 tablet (0.5 mg total) by mouth 3 (three) times daily as needed for anxiety.   amLODipine (NORVASC) 2.5 MG tablet Take 2.5 mg by mouth daily.   fluticasone (FLONASE) 50 MCG/ACT nasal spray Place 2 sprays into both nostrils daily.   amoxicillin-clavulanate (AUGMENTIN) 875-125 MG tablet Take 1 tablet by mouth every 12 (twelve) hours. (Patient not taking: Reported on 03/06/2024)   No facility-administered encounter medications on file as of 03/06/2024.    Allergies (verified) Patient has no known allergies.   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   [facility]    Mixed hyperlipidemia 07/19/2023   Osteoporosis    Past Surgical History:  Procedure Laterality Date   CHOLECYSTECTOMY  11/22/1995   TUBAL LIGATION  11/21/1985   Family History  Problem Relation Age of Onset   Hypertension Mother    Hyperlipidemia Mother    Heart disease Mother    Social History   Socioeconomic History   Marital status: Single    Spouse name: Not on file  Number of children: Not on file   Years of education: Not on file   Highest education level: Not on file  Occupational History   Not on file  Tobacco Use   Smoking status: Never   Smokeless tobacco: Never  Substance and Sexual Activity   Alcohol use: No   Drug use: No   Sexual activity: Not Currently  Other Topics Concern   Not on file  Social History Narrative   Not on file   Social Drivers  of Health   Financial Resource Strain: Low Risk  (03/06/2024)   Overall Financial Resource Strain (CARDIA)    Difficulty of Paying Living Expenses: Not hard at all  Food Insecurity: Food Insecurity Present (03/06/2024)   Hunger Vital Sign    Worried About Running Out of Food in the Last Year: Sometimes true    Ran Out of Food in the Last Year: Sometimes true  Transportation Needs: No Transportation Needs (03/06/2024)   PRAPARE - Administrator, Civil Service (Medical): No    Lack of Transportation (Non-Medical): No  Physical Activity: Insufficiently Active (03/06/2024)   Exercise Vital Sign    Days of Exercise per Week: 3 days    Minutes of Exercise per Session: 30 min  Stress: Stress Concern Present (03/06/2024)   [employer] of Occupational Health - Occupational Stress Questionnaire    Feeling of Stress : To some extent  Social Connections: Moderately Integrated (03/06/2024)   Social Connection and Isolation Panel [NHANES]    Frequency of Communication with Friends and Family: More than three times a week    Frequency of Social Gatherings with Friends and Family: More than three times a week    Attends Religious Services: More than 4 times per year    Active Member of [employer] or Organizations: Yes    Attends Engineer, structural: More than 4 times per year    Marital Status: Divorced    Tobacco Counseling Counseling given: Not Answered    Clinical Intake:     Pain : No/denies pain     BMI - recorded: 33.28 Nutritional Status: BMI > 30  Obese Nutritional Risks: None Diabetes: No  Lab Results  Component Value Date   HGBA1C 5.8 (H) 01/17/2023   HGBA1C 5.7 (H) 03/01/2022     How often do you need to have someone help you when you read instructions, pamphlets, or other written materials from your doctor or pharmacy?: 1 - Never What is the last grade level you completed in school?: some college  Interpreter Needed?: No  Information entered by  :: [employer]   Activities of Daily Living     03/06/2024    1:26 PM  In your present state of health, do you have any difficulty performing the following activities:  Hearing? 0  Vision? 0  Difficulty concentrating or making decisions? 0  Walking or climbing stairs? 0  Dressing or bathing? 0  Doing errands, shopping? 0  Preparing Food and eating ? N  Using the Toilet? N  In the past six months, have you accidently leaked urine? N  Comment yes  Do you have problems with loss of bowel control? N  Managing your Medications? N  Managing your Finances? N  Housekeeping or managing your Housekeeping? N    Patient Care Team: Meldon Sport, MD as PCP - General (Internal Medicine)  Indicate any recent Medical Services you may have received from other than [facility] providers in the past year (date  may be approximate).     Assessment:   This is a routine wellness examination for Judith Walker.  Hearing/Vision screen Hearing Screening - Comments:: No hearing issues Vision Screening - Comments:: Wears glasses   Goals Addressed               This Visit's Progress     Patient Stated (pt-stated)        Lose weight       Depression Screen     03/06/2024    1:27 PM 07/19/2023    1:16 PM 03/06/2023    3:29 PM 01/17/2023    1:09 PM 05/17/2022    8:32 AM 03/03/2022   11:44 AM 03/03/2022   11:41 AM  PHQ 2/9 Scores  PHQ - 2 Score 0 0 0 0 0 0 0  PHQ- 9 Score 0 4         Fall Risk     03/06/2024    1:25 PM 07/19/2023    1:16 PM 03/06/2023    3:29 PM 03/06/2023    7:12 AM 01/17/2023    1:08 PM  Fall Risk   Falls in the past year? 1 0 0 0 0  Number falls in past yr: 0 0   0  Injury with Fall? 1 0   0  Risk for fall due to : Impaired balance/gait;Impaired mobility;History of fall(s) No Fall Risks     Follow up Falls evaluation completed Falls evaluation completed       MEDICARE RISK AT HOME:  Medicare Risk at Home Any stairs in or around the home?: Yes If so, are there any  without handrails?: No Home free of loose throw rugs in walkways, pet beds, electrical cords, etc?: Yes Adequate lighting in your home to reduce risk of falls?: Yes Life alert?: No Use of a cane, walker or w/c?: Yes (cane sometimes) Grab bars in the bathroom?: No Shower chair or bench in shower?: No Elevated toilet seat or a handicapped toilet?: No  TIMED UP AND GO:  Was the test performed?  No  Cognitive Function: 6CIT completed    03/03/2022   11:45 AM  MMSE - Mini Mental State Exam  Not completed: Unable to complete        03/06/2024    1:23 PM 03/06/2023    3:29 PM 03/03/2022   11:45 AM  6CIT Screen  What Year? 0 points 0 points 0 points  What month? 0 points 0 points 0 points  What time? 0 points 0 points 0 points  Count back from 20 0 points 0 points 0 points  Months in reverse 0 points 0 points 0 points  Repeat phrase 0 points 0 points 0 points  Total Score 0 points 0 points 0 points    Immunizations Immunization History  Administered Date(s) Administered   Fluad Quad(high Dose 65+) 08/24/2022   Fluad Trivalent(High Dose 65+) 07/19/2023   Hepatitis B 08/31/1992, 09/28/1992, 03/03/1993   Influenza,inj,Quad PF,6+ Mos 10/15/2013, 12/02/2014, 09/15/2016, 10/11/2017, 08/01/2018, 09/03/2019, 09/02/2020, 09/13/2021   Influenza-Unspecified 09/21/2005, 10/16/2007, 10/26/2009, 09/15/2010, 10/17/2011   Moderna Covid-19 Fall Seasonal Vaccine 58yrs & older 09/04/2023   Moderna SARS-COV2 Booster Vaccination 11/19/2020   Moderna Sars-Covid-2 Vaccination 01/21/2020, 02/18/2020   PPD Test 12/02/2014   Td 07/12/2011   Tdap 07/12/2011    Screening Tests Health Maintenance  Topic Date Due   Pneumonia Vaccine 67+ Years old (1 of 2 - PCV) Never done   Zoster Vaccines- Shingrix (1 of 2) Never done  Colonoscopy  Never done   MAMMOGRAM  Never done   DTaP/Tdap/Td (3 - Td or Tdap) 07/11/2021   COVID-19 Vaccine (4 - Moderna risk 2024-25 season) 03/04/2024   INFLUENZA VACCINE   06/21/2024   Medicare Annual Wellness (AWV)  03/06/2025   DEXA SCAN  Completed   Hepatitis C Screening  Completed   HPV VACCINES  Aged Out   Meningococcal B Vaccine  Aged Out    Health Maintenance  Health Maintenance Due  Topic Date Due   Pneumonia Vaccine 34+ Years old (1 of 2 - PCV) Never done   Zoster Vaccines- Shingrix (1 of 2) Never done   Colonoscopy  Never done   MAMMOGRAM  Never done   DTaP/Tdap/Td (3 - Td or Tdap) 07/11/2021   COVID-19 Vaccine (4 - Moderna risk 2024-25 season) 03/04/2024   Health Maintenance Items Addressed:patient states she would like to declined all vaccinations. She also states she have to think about the colonoscopy and mammogram order has already been placed   Additional Screening:  Vision Screening: Recommended annual ophthalmology exams for early detection of glaucoma and other disorders of the eye.  Dental Screening: Recommended annual dental exams for proper oral hygiene  Community Resource Referral / Chronic Care Management: CRR required this visit?  No   CCM required this visit?  No     Plan:     I have personally reviewed and noted the following in the patient's chart:   Medical and social history Use of alcohol, tobacco or illicit drugs  Current medications and supplements including opioid prescriptions. Patient is not currently taking opioid prescriptions. Functional ability and status Nutritional status Physical activity Advanced directives List of other physicians Hospitalizations, surgeries, and ER visits in previous 12 months Vitals Screenings to include cognitive, depression, and falls Referrals and appointments  In addition, I have reviewed and discussed with patient certain preventive protocols, quality metrics, and best practice recommendations. A written personalized care plan for preventive services as well as general preventive health recommendations were provided to patient.     Freeda Jerry,  [place]   03/06/2024   After Visit Summary: (MyChart) Due to this being a telephonic visit, the after visit summary with patients personalized plan was offered to patient via MyChart   Notes: Nothing significant to report at this time.

## 2024-03-06 NOTE — Patient Instructions (Signed)
 Judith Walker , Thank you for taking time to come for your Medicare Wellness Visit. I appreciate your ongoing commitment to your health goals. Please review the following plan we discussed and let me know if I can assist you in the future.   Referrals/Orders/Follow-Ups/Clinician Recommendations: Follow Up in 1 year.  This is a list of the screening recommended for you and due dates:  Health Maintenance  Topic Date Due   Pneumonia Vaccine (1 of 2 - PCV) Never done   Zoster (Shingles) Vaccine (1 of 2) Never done   Colon Cancer Screening  Never done   Mammogram  Never done   DTaP/Tdap/Td vaccine (3 - Td or Tdap) 07/11/2021   COVID-19 Vaccine (4 - Moderna risk 2024-25 season) 03/04/2024   Flu Shot  06/21/2024   Medicare Annual Wellness Visit  03/06/2025   DEXA scan (bone density measurement)  Completed   Hepatitis C Screening  Completed   HPV Vaccine  Aged Out   Meningitis B Vaccine  Aged Out    Advanced directives: (Declined) Advance directive discussed with you today. Even though you declined this today, please call our office should you change your mind, and we can give you the proper paperwork for you to fill out.  Next Medicare Annual Wellness Visit scheduled for next year: Yes

## 2024-03-28 ENCOUNTER — Ambulatory Visit (INDEPENDENT_AMBULATORY_CARE_PROVIDER_SITE_OTHER): Admitting: Internal Medicine

## 2024-03-28 ENCOUNTER — Encounter: Payer: Self-pay | Admitting: Internal Medicine

## 2024-03-28 VITALS — BP 118/72 | HR 76 | Ht 64.0 in | Wt 205.8 lb

## 2024-03-28 DIAGNOSIS — I10 Essential (primary) hypertension: Secondary | ICD-10-CM

## 2024-03-28 DIAGNOSIS — E559 Vitamin D deficiency, unspecified: Secondary | ICD-10-CM | POA: Diagnosis not present

## 2024-03-28 DIAGNOSIS — C9591 Leukemia, unspecified, in remission: Secondary | ICD-10-CM

## 2024-03-28 DIAGNOSIS — E782 Mixed hyperlipidemia: Secondary | ICD-10-CM | POA: Diagnosis not present

## 2024-03-28 DIAGNOSIS — Z0001 Encounter for general adult medical examination with abnormal findings: Secondary | ICD-10-CM

## 2024-03-28 DIAGNOSIS — R7303 Prediabetes: Secondary | ICD-10-CM

## 2024-03-28 DIAGNOSIS — Z1211 Encounter for screening for malignant neoplasm of colon: Secondary | ICD-10-CM | POA: Diagnosis not present

## 2024-03-28 DIAGNOSIS — F411 Generalized anxiety disorder: Secondary | ICD-10-CM

## 2024-03-28 DIAGNOSIS — M1710 Unilateral primary osteoarthritis, unspecified knee: Secondary | ICD-10-CM

## 2024-03-28 MED ORDER — MELOXICAM 15 MG PO TABS: 15.0000 mg | ORAL_TABLET | Freq: Every day | ORAL | 0 refills | Status: AC

## 2024-03-28 MED ORDER — FELODIPINE ER 10 MG PO TB24: 10.0000 mg | ORAL_TABLET | Freq: Every day | ORAL | 2 refills | Status: DC

## 2024-03-28 NOTE — Assessment & Plan Note (Signed)
In 90s, treated at [facility] In remission 

## 2024-03-28 NOTE — Progress Notes (Signed)
 Established Patient Office Visit  Subjective:  Patient ID: Judith Walker, female    DOB: 1955-12-07  Age: 68 y.o. MRN: [id number]  CC:  Chief Complaint  Patient presents with   Annual Exam    Annual cpe.     HPI Judith Walker is a 68 y.o. 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  HTN: BP is well-controlled. Takes medications regularly. Patient denies headache, dizziness, chest pain, dyspnea or palpitations.   She has been having b/l knee pain, for which she takes Tylenol as needed, but still has swelling and severe pain at times.  She denies any recent injury or fall.  She has seen Dr. Phyllis Breeze for it.  She wants to wait for now for PCV20.   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   [facility]    Mixed hyperlipidemia 07/19/2023   Osteoporosis     Past Surgical History:  Procedure Laterality Date   CHOLECYSTECTOMY  11/22/1995   TUBAL LIGATION  11/21/1985    Family History  Problem Relation Age of Onset   Hypertension Mother    Hyperlipidemia Mother    Heart disease Mother     Social History   Socioeconomic History   Marital status: Single    Spouse name: Not on file   Number of children: Not on file   Years of education: Not on file   Highest education level: Not on file  Occupational History   Not on file  Tobacco Use   Smoking status: Never   Smokeless tobacco: Never  Substance and Sexual Activity   Alcohol use: No   Drug use: No   Sexual activity: Not Currently  Other Topics Concern   Not on file  Social History Narrative   Not on file   Social Drivers of Health   Financial Resource Strain: Low Risk  (03/06/2024)   Overall Financial Resource Strain (CARDIA)    Difficulty of Paying Living Expenses: Not  hard at all  Food Insecurity: Food Insecurity Present (03/06/2024)   Hunger Vital Sign    Worried About Running Out of Food in the Last Year: Sometimes true    Ran Out of Food in the Last Year: Sometimes true  Transportation Needs: No Transportation Needs (03/06/2024)   PRAPARE - Administrator, Civil Service (Medical): No    Lack of Transportation (Non-Medical): No  Physical Activity: Insufficiently Active (03/06/2024)   Exercise Vital Sign    Days of Exercise per Week: 3 days    Minutes of Exercise per Session: 30 min  Stress: Stress Concern Present (03/06/2024)   [employer] of Occupational Health - Occupational Stress Questionnaire    Feeling of Stress : To some extent  Social Connections: Moderately Integrated (03/06/2024)   Social Connection and Isolation Panel [NHANES]    Frequency of Communication with Friends and Family: More than three times a week    Frequency of Social Gatherings with Friends and Family: More than three times a week    Attends Religious Services: More than 4 times per year    Active Member of [employer] or Organizations: Yes    Attends Engineer, structural: More than 4 times per year    Marital Status: Divorced  Intimate Partner Violence: Not At Risk (03/06/2024)   Humiliation, Afraid, Rape, and Kick questionnaire    Fear of Current or Ex-Partner: No    Emotionally Abused: No    Physically Abused: No    Sexually Abused: No    Outpatient Medications Prior to Visit  Medication Sig Dispense Refill   ALPRAZolam  (XANAX ) 0.5 MG tablet Take 1 tablet (0.5 mg total) by mouth 3 (three) times daily as needed for anxiety. 20 tablet 0   fluticasone  (FLONASE ) 50 MCG/ACT nasal spray Place 2 sprays into both nostrils daily. 16 g 0   amLODipine (NORVASC) 2.5 MG tablet Take 2.5 mg by mouth daily.     amoxicillin -clavulanate (AUGMENTIN ) 875-125 MG tablet Take 1 tablet by mouth every 12 (twelve) hours. 14 tablet 0   No facility-administered medications  prior to visit.    No Known Allergies  ROS Review of Systems  Constitutional:  Negative for chills and fever.  HENT:  Negative for congestion, sinus pressure, sinus pain and sore throat.   Eyes:  Negative for pain and discharge.  Respiratory:  Negative for cough and shortness of breath.   Cardiovascular:  Negative for chest pain and palpitations.  Gastrointestinal:  Negative for abdominal pain, diarrhea, nausea and vomiting.  Endocrine: Negative for polydipsia and polyuria.  Genitourinary:  Negative for dysuria and hematuria.  Musculoskeletal:  Positive for arthralgias. Negative for neck pain and neck stiffness.  Skin:  Negative for rash.  Neurological:  Negative for dizziness and weakness.  Psychiatric/Behavioral:  Negative for agitation and behavioral problems.       Objective:    Physical Exam Vitals reviewed.  Constitutional:      General: She is not in acute distress.    Appearance: She is obese. She is not diaphoretic.  HENT:     Head: Normocephalic and atraumatic.     Nose: Nose normal.     Mouth/Throat:     Mouth: Mucous membranes are moist.  Eyes:     General: No scleral icterus.    Extraocular Movements: Extraocular movements intact.  Cardiovascular:     Rate and Rhythm: Normal rate and regular rhythm.     Heart sounds: Normal heart sounds. No murmur heard. Pulmonary:     Breath sounds: Normal breath sounds. No wheezing or rales.  Abdominal:     Palpations: Abdomen is soft.     Tenderness: There is no abdominal tenderness.  Musculoskeletal:     Cervical back: Neck supple. No tenderness.     Right knee: Swelling present. Decreased range of motion.     Left knee: Swelling present. Decreased range of motion.     Right lower leg: No edema.     Left lower leg: No edema.  Skin:    General: Skin is warm.     Findings: No rash.  Neurological:     General: No focal deficit present.     Mental Status: She is alert and oriented to person, place, and time.      Cranial Nerves: No cranial nerve deficit.     Sensory: No sensory deficit.     Motor: No weakness.  Psychiatric:        Mood and Affect: Mood normal.        Behavior: Behavior normal.     BP 118/72   Pulse 76   Ht 5\' 4"  (1.626 m)   Wt 205 lb 12.8 oz (93.4 kg)   SpO2 95%   BMI 35.33 kg/m  Wt Readings from Last  3 Encounters:  03/28/24 205 lb 12.8 oz (93.4 kg)  03/06/24 200 lb (90.7 kg)  08/25/23 203 lb (92.1 kg)    Lab Results  Component Value Date   TSH 0.541 01/17/2023   Lab Results  Component Value Date   WBC 5.5 01/17/2023   HGB 13.9 01/17/2023   HCT 43.3 01/17/2023   MCV 92 01/17/2023   PLT 310 01/17/2023   Lab Results  Component Value Date   NA 137 01/17/2023   K 4.3 01/17/2023   CO2 22 01/17/2023   GLUCOSE 78 01/17/2023   BUN 11 01/17/2023   CREATININE 0.97 01/17/2023   BILITOT 0.3 01/17/2023   ALKPHOS 104 01/17/2023   AST 15 01/17/2023   ALT 10 01/17/2023   PROT 8.1 01/17/2023   ALBUMIN 4.5 01/17/2023   CALCIUM 9.7 01/17/2023   EGFR 64 01/17/2023   Lab Results  Component Value Date   CHOL 186 01/17/2023   Lab Results  Component Value Date   HDL 42 01/17/2023   Lab Results  Component Value Date   LDLCALC 120 (H) 01/17/2023   Lab Results  Component Value Date   TRIG 132 01/17/2023   Lab Results  Component Value Date   CHOLHDL 4.4 01/17/2023   Lab Results  Component Value Date   HGBA1C 5.8 (H) 01/17/2023      Assessment & Plan:   Problem List Items Addressed This Visit       Cardiovascular and Mediastinum   Essential hypertension, benign   BP Readings from Last 1 Encounters:  03/28/24 118/72   Usually well-controlled with Felodipine  Counseled for compliance with the medications Advised DASH diet and moderate exercise/walking, at least 150 mins/week      Relevant Medications   felodipine  (PLENDIL ) 10 MG 24 hr tablet   Other Relevant Orders   TSH   CMP14+EGFR   CBC with Differential/Platelet     Musculoskeletal and  Integument   Arthritis of knee   Takes Tylenol as needed Referred to Orthopedic surgery - has h/o moderate to severe OA of knee noted in previous x-ray of knee b/l, was told to contact if worsening of pain  Needs to focus on weight loss as well      Relevant Medications   meloxicam  (MOBIC ) 15 MG tablet     Other   Vitamin D  deficiency   Advised to take Vitamin D  5000 IU QD      Relevant Orders   VITAMIN D  25 Hydroxy (Vit-D Deficiency, Fractures)   GAD (generalized anxiety disorder)   Xanax  0.5 mg as needed, takes it occasionally      Leukemia (HCC)   In 90s, treated at [facility] In remission      Relevant Medications   meloxicam  (MOBIC ) 15 MG tablet   Other Relevant Orders   CBC with Differential/Platelet   Encounter for general adult medical examination with abnormal findings - Primary   Physical exam as documented. Counseling done  re healthy lifestyle involving commitment to 150 minutes exercise per week, heart healthy diet, and attaining healthy weight.The importance of adequate sleep also discussed. Changes in health habits are decided on by the patient with goals and time frames  set for achieving them. Immunization and cancer screening needs are specifically addressed at this visit.  Mammography - she states that she will schedule by calling and DEXA scan scheduled. Wants to wait for PCV20 for now.  Advised to get Shingrix at local pharmacy.      Prediabetes  Lab Results  Component Value Date   HGBA1C 5.8 (H) 01/17/2023   Advised to follow DASH diet for now      Relevant Orders   Hemoglobin A1c   Mixed hyperlipidemia   Lipid profile reviewed Advised to follow DASH diet      Relevant Medications   felodipine  (PLENDIL ) 10 MG 24 hr tablet   Other Relevant Orders   Lipid panel   Other Visit Diagnoses       Colon cancer screening       Relevant Orders   Cologuard        Meds ordered this encounter  Medications   meloxicam  (MOBIC ) 15 MG  tablet    Sig: Take 1 tablet (15 mg total) by mouth daily.    Dispense:  30 tablet    Refill:  0   felodipine  (PLENDIL ) 10 MG 24 hr tablet    Sig: Take 1 tablet (10 mg total) by mouth daily.    Dispense:  100 tablet    Refill:  2    Follow-up: Return in about 6 months (around 09/28/2024) for HTN.    Meldon Sport, MD

## 2024-03-28 NOTE — Assessment & Plan Note (Addendum)
 Takes Tylenol as needed Referred to Orthopedic surgery - has h/o moderate to severe OA of knee noted in previous x-ray of knee b/l, was told to contact if worsening of pain  Needs to focus on weight loss as well

## 2024-03-28 NOTE — Assessment & Plan Note (Signed)
Lab Results  Component Value Date   HGBA1C 5.8 (H) 01/17/2023   Advised to follow DASH diet for now

## 2024-03-28 NOTE — Assessment & Plan Note (Signed)
Advised to take Vitamin D 5000 IU QD 

## 2024-03-28 NOTE — Assessment & Plan Note (Signed)
Physical exam as documented. Counseling done  re healthy lifestyle involving commitment to 150 minutes exercise per week, heart healthy diet, and attaining healthy weight.The importance of adequate sleep also discussed. Changes in health habits are decided on by the patient with goals and time frames  set for achieving them. Immunization and cancer screening needs are specifically addressed at this visit.  Mammography - she states that she will schedule by calling and DEXA scan scheduled. Wants to wait for PCV20 for now.  Advised to get Shingrix at local pharmacy.

## 2024-03-28 NOTE — Assessment & Plan Note (Signed)
Xanax 0.5 mg as needed, takes it occasionally

## 2024-03-28 NOTE — Assessment & Plan Note (Signed)
 BP Readings from Last 1 Encounters:  03/28/24 118/72   Usually well-controlled with Felodipine  Counseled for compliance with the medications Advised DASH diet and moderate exercise/walking, at least 150 mins/week

## 2024-03-28 NOTE — Assessment & Plan Note (Signed)
Lipid profile reviewed Advised to follow DASH diet 

## 2024-03-28 NOTE — Patient Instructions (Addendum)
Please continue to take medications as prescribed.  Please continue to follow DASH diet and perform moderate exercise/walking at least 150 mins/week.  Please consider getting Shingrix and Tdap vaccine at local pharmacy.  

## 2024-03-29 ENCOUNTER — Encounter: Payer: Self-pay | Admitting: Internal Medicine

## 2024-03-29 LAB — CBC WITH DIFFERENTIAL/PLATELET
Basophils Absolute: 0.1 10*3/uL (ref 0.0–0.2)
Basos: 1 %
EOS (ABSOLUTE): 0.2 10*3/uL (ref 0.0–0.4)
Eos: 3 %
Hematocrit: 41.1 % (ref 34.0–46.6)
Hemoglobin: 13.1 g/dL (ref 11.1–15.9)
Immature Grans (Abs): 0 10*3/uL (ref 0.0–0.1)
Immature Granulocytes: 0 %
Lymphocytes Absolute: 2.4 10*3/uL (ref 0.7–3.1)
Lymphs: 45 %
MCH: 30 pg (ref 26.6–33.0)
MCHC: 31.9 g/dL (ref 31.5–35.7)
MCV: 94 fL (ref 79–97)
Monocytes Absolute: 0.5 10*3/uL (ref 0.1–0.9)
Monocytes: 9 %
Neutrophils Absolute: 2.3 10*3/uL (ref 1.4–7.0)
Neutrophils: 42 %
Platelets: 313 10*3/uL (ref 150–450)
RBC: 4.37 x10E6/uL (ref 3.77–5.28)
RDW: 13.1 % (ref 11.7–15.4)
WBC: 5.3 10*3/uL (ref 3.4–10.8)

## 2024-03-29 LAB — CMP14+EGFR
ALT: 7 IU/L (ref 0–32)
AST: 14 IU/L (ref 0–40)
Albumin: 4.3 g/dL (ref 3.9–4.9)
Alkaline Phosphatase: 99 IU/L (ref 44–121)
BUN/Creatinine Ratio: 14 (ref 12–28)
BUN: 13 mg/dL (ref 8–27)
Bilirubin Total: 0.3 mg/dL (ref 0.0–1.2)
CO2: 24 mmol/L (ref 20–29)
Calcium: 9.6 mg/dL (ref 8.7–10.3)
Chloride: 103 mmol/L (ref 96–106)
Creatinine, Ser: 0.92 mg/dL (ref 0.57–1.00)
Globulin, Total: 3.3 g/dL (ref 1.5–4.5)
Glucose: 80 mg/dL (ref 70–99)
Potassium: 4.5 mmol/L (ref 3.5–5.2)
Sodium: 140 mmol/L (ref 134–144)
Total Protein: 7.6 g/dL (ref 6.0–8.5)
eGFR: 68 mL/min/{1.73_m2} (ref >59)

## 2024-03-29 LAB — LIPID PANEL
Chol/HDL Ratio: 3.6 ratio (ref 0.0–4.4)
Cholesterol, Total: 178 mg/dL (ref 100–199)
HDL: 50 mg/dL (ref >39)
LDL Chol Calc (NIH): 111 mg/dL — ABNORMAL HIGH (ref 0–99)
Triglycerides: 92 mg/dL (ref 0–149)
VLDL Cholesterol Cal: 17 mg/dL (ref 5–40)

## 2024-03-29 LAB — HEMOGLOBIN A1C
Est. average glucose Bld gHb Est-mCnc: 114 mg/dL
Hgb A1c MFr Bld: 5.6 % (ref 4.8–5.6)

## 2024-03-29 LAB — VITAMIN D 25 HYDROXY (VIT D DEFICIENCY, FRACTURES): Vit D, 25-Hydroxy: 23.7 ng/mL — ABNORMAL LOW (ref 30.0–100.0)

## 2024-03-29 LAB — TSH: TSH: 0.622 u[IU]/mL (ref 0.450–4.500)

## 2024-04-08 ENCOUNTER — Ambulatory Visit: Payer: Medicare Other

## 2024-10-02 ENCOUNTER — Ambulatory Visit: Admitting: Internal Medicine

## 2024-11-08 ENCOUNTER — Ambulatory Visit: Admitting: Internal Medicine

## 2024-12-08 ENCOUNTER — Other Ambulatory Visit: Payer: Self-pay | Admitting: Internal Medicine

## 2024-12-08 DIAGNOSIS — I10 Essential (primary) hypertension: Secondary | ICD-10-CM

## 2025-03-11 ENCOUNTER — Ambulatory Visit
